# Patient Record
Sex: Female | Born: 1955 | Race: Black or African American | Hispanic: No | Marital: Married | Smoking: Never smoker
Health system: Southern US, Community
[De-identification: ages and names within clinical notes are randomized; demographics above are authoritative.]

## PROBLEM LIST (undated history)

## (undated) ENCOUNTER — Ambulatory Visit: Admission: EM | Payer: 59

## (undated) DIAGNOSIS — E119 Type 2 diabetes mellitus without complications: Secondary | ICD-10-CM

## (undated) DIAGNOSIS — I2510 Atherosclerotic heart disease of native coronary artery without angina pectoris: Secondary | ICD-10-CM

## (undated) DIAGNOSIS — G4730 Sleep apnea, unspecified: Secondary | ICD-10-CM

## (undated) DIAGNOSIS — I10 Essential (primary) hypertension: Secondary | ICD-10-CM

## (undated) DIAGNOSIS — R32 Unspecified urinary incontinence: Secondary | ICD-10-CM

## (undated) DIAGNOSIS — M797 Fibromyalgia: Secondary | ICD-10-CM

## (undated) DIAGNOSIS — N361 Urethral diverticulum: Secondary | ICD-10-CM

## (undated) HISTORY — DX: Type 2 diabetes mellitus without complications: E11.9

## (undated) HISTORY — PX: CARDIAC CATHETERIZATION: SHX172

## (undated) HISTORY — DX: Fibromyalgia: M79.7

## (undated) HISTORY — DX: Urethral diverticulum: N36.1

## (undated) HISTORY — DX: Unspecified urinary incontinence: R32

## (undated) HISTORY — DX: Essential (primary) hypertension: I10

## (undated) HISTORY — PX: FOOT SURGERY: SHX648

## (undated) HISTORY — PX: KNEE SURGERY: SHX244

---

## 1999-04-22 ENCOUNTER — Encounter: Admission: RE | Admit: 1999-04-22 | Discharge: 1999-07-21 | Payer: Self-pay | Admitting: Family Medicine

## 1999-06-13 ENCOUNTER — Other Ambulatory Visit: Admission: RE | Admit: 1999-06-13 | Discharge: 1999-06-13 | Payer: Self-pay | Admitting: Obstetrics and Gynecology

## 1999-11-29 ENCOUNTER — Emergency Department (HOSPITAL_COMMUNITY): Admission: EM | Admit: 1999-11-29 | Discharge: 1999-11-29 | Payer: Self-pay | Admitting: Emergency Medicine

## 1999-12-08 HISTORY — PX: CARDIAC CATHETERIZATION: SHX172

## 2000-02-16 ENCOUNTER — Inpatient Hospital Stay (HOSPITAL_COMMUNITY): Admission: EM | Admit: 2000-02-16 | Discharge: 2000-02-17 | Payer: Self-pay | Admitting: Emergency Medicine

## 2000-02-16 ENCOUNTER — Encounter: Payer: Self-pay | Admitting: *Deleted

## 2000-02-18 ENCOUNTER — Ambulatory Visit (HOSPITAL_COMMUNITY): Admission: RE | Admit: 2000-02-18 | Discharge: 2000-02-18 | Payer: Self-pay | Admitting: *Deleted

## 2000-10-27 ENCOUNTER — Other Ambulatory Visit: Admission: RE | Admit: 2000-10-27 | Discharge: 2000-10-27 | Payer: Self-pay | Admitting: Obstetrics and Gynecology

## 2000-11-22 ENCOUNTER — Encounter: Payer: Self-pay | Admitting: Gastroenterology

## 2000-11-22 ENCOUNTER — Ambulatory Visit (HOSPITAL_COMMUNITY): Admission: RE | Admit: 2000-11-22 | Discharge: 2000-11-22 | Payer: Self-pay | Admitting: Gastroenterology

## 2000-12-28 ENCOUNTER — Ambulatory Visit (HOSPITAL_COMMUNITY): Admission: RE | Admit: 2000-12-28 | Discharge: 2000-12-28 | Payer: Self-pay | Admitting: Gastroenterology

## 2000-12-28 ENCOUNTER — Encounter (INDEPENDENT_AMBULATORY_CARE_PROVIDER_SITE_OTHER): Payer: Self-pay | Admitting: Specialist

## 2000-12-28 ENCOUNTER — Encounter: Payer: Self-pay | Admitting: Gastroenterology

## 2001-06-07 ENCOUNTER — Encounter: Admission: RE | Admit: 2001-06-07 | Discharge: 2001-06-07 | Payer: Self-pay | Admitting: *Deleted

## 2001-11-27 ENCOUNTER — Encounter: Payer: Self-pay | Admitting: Emergency Medicine

## 2001-11-27 ENCOUNTER — Emergency Department (HOSPITAL_COMMUNITY): Admission: EM | Admit: 2001-11-27 | Discharge: 2001-11-27 | Payer: Self-pay | Admitting: Emergency Medicine

## 2001-12-19 ENCOUNTER — Inpatient Hospital Stay (HOSPITAL_COMMUNITY): Admission: AD | Admit: 2001-12-19 | Discharge: 2001-12-22 | Payer: Self-pay | Admitting: *Deleted

## 2001-12-26 ENCOUNTER — Encounter: Admission: RE | Admit: 2001-12-26 | Discharge: 2002-03-26 | Payer: Self-pay | Admitting: *Deleted

## 2002-09-06 ENCOUNTER — Emergency Department (HOSPITAL_COMMUNITY): Admission: EM | Admit: 2002-09-06 | Discharge: 2002-09-06 | Payer: Self-pay | Admitting: Emergency Medicine

## 2002-10-15 ENCOUNTER — Emergency Department (HOSPITAL_COMMUNITY): Admission: EM | Admit: 2002-10-15 | Discharge: 2002-10-16 | Payer: Self-pay | Admitting: Emergency Medicine

## 2003-02-08 ENCOUNTER — Other Ambulatory Visit: Admission: RE | Admit: 2003-02-08 | Discharge: 2003-02-08 | Payer: Self-pay | Admitting: Obstetrics and Gynecology

## 2004-02-11 ENCOUNTER — Other Ambulatory Visit: Admission: RE | Admit: 2004-02-11 | Discharge: 2004-02-11 | Payer: Self-pay | Admitting: Obstetrics and Gynecology

## 2005-06-11 ENCOUNTER — Other Ambulatory Visit: Admission: RE | Admit: 2005-06-11 | Discharge: 2005-06-11 | Payer: Self-pay | Admitting: Obstetrics and Gynecology

## 2006-06-01 ENCOUNTER — Encounter: Admission: RE | Admit: 2006-06-01 | Discharge: 2006-06-01 | Payer: Self-pay | Admitting: Internal Medicine

## 2009-08-22 ENCOUNTER — Ambulatory Visit: Payer: Self-pay | Admitting: Obstetrics and Gynecology

## 2009-08-22 ENCOUNTER — Other Ambulatory Visit: Admission: RE | Admit: 2009-08-22 | Discharge: 2009-08-22 | Payer: Self-pay | Admitting: Obstetrics and Gynecology

## 2009-08-22 ENCOUNTER — Encounter: Payer: Self-pay | Admitting: Obstetrics and Gynecology

## 2009-11-06 ENCOUNTER — Ambulatory Visit: Payer: Self-pay | Admitting: Vascular Surgery

## 2010-02-11 ENCOUNTER — Encounter: Admission: RE | Admit: 2010-02-11 | Discharge: 2010-02-11 | Payer: Self-pay | Admitting: Internal Medicine

## 2010-06-19 ENCOUNTER — Ambulatory Visit (HOSPITAL_COMMUNITY): Admission: RE | Admit: 2010-06-19 | Discharge: 2010-06-20 | Payer: Self-pay | Admitting: Orthopedic Surgery

## 2011-01-01 ENCOUNTER — Other Ambulatory Visit: Payer: Self-pay | Admitting: Obstetrics and Gynecology

## 2011-01-01 ENCOUNTER — Other Ambulatory Visit (HOSPITAL_COMMUNITY)
Admission: RE | Admit: 2011-01-01 | Discharge: 2011-01-01 | Disposition: A | Discharge status: Home / Self Care | Payer: BC Managed Care – PPO | Source: Ambulatory Visit | Attending: Obstetrics and Gynecology | Admitting: Obstetrics and Gynecology

## 2011-01-01 ENCOUNTER — Ambulatory Visit
Admission: RE | Admit: 2011-01-01 | Discharge: 2011-01-01 | Payer: Self-pay | Source: Home / Self Care | Attending: Obstetrics and Gynecology | Admitting: Obstetrics and Gynecology

## 2011-01-01 DIAGNOSIS — Z124 Encounter for screening for malignant neoplasm of cervix: Secondary | ICD-10-CM | POA: Insufficient documentation

## 2011-02-21 LAB — GLUCOSE, CAPILLARY: Glucose-Capillary: 86 mg/dL (ref 70–99)

## 2011-02-22 LAB — URINE MICROSCOPIC-ADD ON

## 2011-02-22 LAB — SURGICAL PCR SCREEN: Staphylococcus aureus: NEGATIVE

## 2011-02-22 LAB — DIFFERENTIAL
Lymphs Abs: 2.4 10*3/uL (ref 0.7–4.0)
Neutro Abs: 6.4 10*3/uL (ref 1.7–7.7)

## 2011-02-22 LAB — GLUCOSE, CAPILLARY
Glucose-Capillary: 119 mg/dL — ABNORMAL HIGH (ref 70–99)
Glucose-Capillary: 257 mg/dL — ABNORMAL HIGH (ref 70–99)
Glucose-Capillary: 83 mg/dL (ref 70–99)
Glucose-Capillary: 94 mg/dL (ref 70–99)
Glucose-Capillary: 98 mg/dL (ref 70–99)

## 2011-02-22 LAB — URINALYSIS, ROUTINE W REFLEX MICROSCOPIC
Bilirubin Urine: NEGATIVE
Hgb urine dipstick: NEGATIVE
Ketones, ur: NEGATIVE mg/dL
Protein, ur: NEGATIVE mg/dL
pH: 6 (ref 5.0–8.0)

## 2011-02-22 LAB — COMPREHENSIVE METABOLIC PANEL
Calcium: 8.7 mg/dL (ref 8.4–10.5)
GFR calc Af Amer: >60 mL/min (ref >60)
GFR calc non Af Amer: >60 mL/min (ref >60)
Potassium: 3.8 mEq/L (ref 3.5–5.1)

## 2011-02-22 LAB — CBC
HCT: 33.8 % — ABNORMAL LOW (ref 36.0–46.0)
Hemoglobin: 11.1 g/dL — ABNORMAL LOW (ref 12.0–15.0)
MCV: 75.7 fL — ABNORMAL LOW (ref 78.0–100.0)
RBC: 4.47 MIL/uL (ref 3.87–5.11)
RDW: 17 % — ABNORMAL HIGH (ref 11.5–15.5)

## 2011-02-22 LAB — PROTIME-INR: INR: 1.08 (ref 0.00–1.49)

## 2011-04-21 NOTE — Procedures (Signed)
DUPLEX DEEP VENOUS EXAM - LOWER EXTREMITY   INDICATION:  Calf tenderness and pain, rule out DVT.   HISTORY:  Edema:  No.  Trauma/Surgery:  Hit left knee on a coffee table a few days ago.  Pain:  Left calf tenderness for 2 days.  PE:  No.  Previous DVT:  No.  Anticoagulants:  No.  Other:  No.   DUPLEX EXAM:                CFV   SFV   PopV  PTV    GSV                R  L  R  L  R  L  R   L  R  L  Thrombosis    o  o     o     o      o     o  Spontaneous   +  +     +     +      +     +  Phasic        +  +     +     +      +     +  Augmentation  +  +     +     +      +     +  Compressible  +  +     +     +      +     +  Competent   Legend:  + - yes  o - no  p - partial  D - decreased   IMPRESSION:  No evidence of acute deep vein thrombosis noted in the left  lower extremity, based on decreased visualization of the calf veins due  to patient body habitus.    _____________________________  Larina Earthly, M.D.   CH/MEDQ  D:  11/06/2009  T:  11/07/2009  Job:  [id number]

## 2011-04-24 NOTE — H&P (Signed)
[place]. [facility]  Patient:    Cheryl Mckee, Cheryl Mckee Visit Number: [id number] MRN: [id number]          Service Type: Attending:  Lilia Pro, M.D. Dictated by:   Tarri Fuller, P.A. Adm. Date:  12/19/01                           History and Physical  DATE OF BIRTH: 1956-08-17  CHIEF COMPLAINT: Problems with blood sugar.  HISTORY OF PRESENT ILLNESS: This 55 year old female was seen in the practice Friday with complaints of poor control of her blood sugar - her morning reading typically range between 350-400.  She was complaining of blurred vision, thirstiness, and some nausea.  She was currently at that time on 25 units of NPH and sliding-scale of Humalog daily.  She was changed to 25 units of Lantus and continued on her sliding-scale and Avandia on Friday and a metabolic panel and urinalysis was obtained.  This morning her laboratory work was brought to our attention - she had a glucose level of 880, sodium of 121, potassium 5, chloride 82, BUN 10, creatinine 1.  We recalled her to the office immediately for scheduled admission to the hospital for uncontrolled diabetes, hyperglycemia, and hyponatremia.  This afternoon she reports that she continues to feel the same, with about two episodes of vomiting daily, fatigue and malaise, dizziness upon standing, a very metallic taste in her mouth whenever she tries to eat, urinary frequency, excessive thirst, and blurred vision.  She has had a slight sore throat.  She denies abdominal pain, chest pain or dyspnea, syncope, palpitations, urinary dysuria or hematuria, melena or hematochezia, diarrhea, fever, or rash.  She started on Lantus 25 units Friday evening and is taking about 10 units of her Humalog with meals.  Her sugar has generally been between 350-380 or unregisterable on her glucometer.  CURRENT MEDICATIONS:  1. Lantus 25 units q.p.m.  2. Humalog sliding-scale.  3. Effexor 225 mg q.d.  4. Lexapro  10 mg q.d.  5. Flexeril as needed.  6. Avandia 8 mg q.d.  7. Phenergan as needed.  8. Remeron q.h.s.  9. Celebrex 200 mg q.d. 10. Ultram as needed for pain. 11. Aspirin q.d.  ALLERGIES:  1. DAYPRO.  2. Z-PAK.  3. ACCUPRIL.  PAST MEDICAL HISTORY:  1. Type 2 diabetes, on insulin.  2. Morbid obesity.  3. Hyperlipidemia.  4. Hypertension.  5. GERD.  6. Hepatic steatosis.  7. Fibromyalgia.  8. Chronic depression.  9. History of atypical chest pain.  PAST SURGICAL HISTORY:  1. Cardiac catheterization in February 2001, which was negative.  2. Persantine Cardiolite stress test in February 2001, which showed     multiple defects, probably secondary to breast attenuation, with an     ejection fraction of 56%.  It was impossible to rule out ischemia     based on the artifact present on her study.  CONSULTING PHYSICIANS:  1. Cardiology, Dr. Jenne Campus.  2. Allergist, Dr. [place] Callas.  3. GI, Dr. Loreta Ave.  4. Psychiatrist, Dr. Evelene Croon.  5. Gynecologist, Dr. Eda Paschal.  6. Rheumatology, Dr. Jimmy Footman.  SOCIAL HISTORY: The patient is single, without children, and lives alone.  She does not smoke or use alcohol.  Employed by [employer].  FAMILY HISTORY: Diabetes present in father and grandmother.  Grandmother had a heart attack at age 56.  History of throat cancer in her grandmother, and cervical cancer in one  aunt.  PHYSICAL EXAMINATION:  VITAL SIGNS: Age 55.  Weight 300 pounds, which is up five pounds since Friday, and down 12 pounds since the end of November 2002.  Blood pressure 124/88 and stable.  GENERAL: Morbidly obese African-American female, appears in no acute distress with, as always, a very flat affect.  No dyspnea or confusion is present.  HEENT: Oropharynx pink and moist, with moist mucous membranes and midline uvula without deviation.  Symmetric face.  PERRLA.  EOMI without nystagmus. No visual field deficit.  NECK: Supple without lymphadenopathy,  thyromegaly, retraction, or bruits.  CARDIAC: Tachycardic without murmur.  CHEST: Clear.  ABDOMEN: Morbidly obese, soft, nontender, nondistended, with active bowel sounds.  No rigidity, rebound, or guarding.  Unable to fully assess for organomegaly.  BREAST: Extremely large and pendulous.  GU/RECTAL: Examinations deferred.  EXTREMITIES: Warm and dry without rash.  No peripheral edema.  NEUROLOGIC: Generally areflexic.  Gait is slow but stable.  ASSESSMENT:  1. Profound hyperglycemia with uncontrolled type 2 diabetes.  2. Hyponatremia, hyperosmolar.  3. Nausea and vomiting.  4. Weakness.  5. Blurred vision.  6. Hypertension.  7. Fibromyalgia.  8. Morbid obesity.  9. Gastroesophageal reflux disease. 10. Hepatic steatosis.  PLAN: The patient is admitted to bed 5044 #2 at [facility]. [facility].  Obtain STAT BMP, CBC, and urinalysis on admission, and base further insulin and fluid therapy no these results.  In the interim, start an aggressive low-sensitivity insulin sliding-scale protocol and normal saline at 125 cc per hour.  Portable chest x-ray on admission.  Repeat electrolytes and glucose every two hours x3, then every six hours.  Vitals q.shift.  I&Os q.shift.  Diabetic regular diet, with activity as tolerated.  DTCA consultation.  Possible endocrine consultation will be needed.  Patient admitted to Dr. Johnsie Kindred and discharged from the office with a friend, in stable condition. Dictated by:   Tarri Fuller, P.A. Attending:  Lilia Pro, M.D. DD:  12/19/01 TD:  12/20/01 Job: [id number]

## 2011-04-24 NOTE — Discharge Summary (Signed)
[place]. [facility]  Patient:    Cheryl Mckee, Cheryl Mckee Visit Number: [id number] MRN: [id number]          Service Type: MED Location: [phone] Attending Physician:  Noland Fordyce Dictated by:   Lilia Pro, M.D. Admit Date:  12/19/2001 Discharge Date: 12/22/2001                             Discharge Summary  DISCHARGE DIAGNOSES: 1. Hyperglycemia. 2. Mild hyperosmolar state.  HISTORY:  This is a 56 year old African-American female with type 2 diabetes, who was started on insulin about two months ago.  Her sugars have been running high at home, and she started to have increased urinary frequency, blurred vision, and vomiting.  She was seen in our office by one of our physician assistants on Friday the 11th, and her sugar was checked, but the results did not come in until Monday the 13th.  Because of these results, she was immediately admitted to the hospital for hyperglycemia.  Her sugar was around 800.  She notes that she has been having these symptoms for several days, and that her sugar has basically been reading very high on her home monitor for weeks.  Her exam on admission was basically benign without any abnormalities noted.  HOSPITAL COURSE:  The patient was admitted and started on subcu sliding scale insulin.  On the first evening she was here, this did have to be changed to an insulin drip.  She was also increased on her NPH from 20 units q.d. up to 25 units b.i.d.  Throughout her three day hospital course, she was frequently switched from her insulin drip on the Glucomander back to a subcu sliding scale when her sugars would go high, which was more likely in the evenings. She had no complaints of chest pain, shortness of breath, nausea, vomiting. She felt well throughout her hospital stay; however, continued to have high sugars.  Her sugars did eventually come down to around the 200 level.  We did get a diabetic coordinator to see her, who  recommended outpatient treatment as well as some recommendations on how to put her on NPH with the Glucomander. We did get her set up for outpatient education on Monday the 20th at 5:30.  She will be discharged today in stable condition.  She will be discharged on a very different sliding scale than she had before.  Her regular sliding scale will be as follows:  For sugars 151-250, she should take 6 units of insulin regular; 251-350, 12 units; 351-450, 15 units.  She will also increase her NPH to 25 units b.i.d.  I have given her strict instructions on this.  She will be checking her sugars before breakfast and dinner and using the sliding scale.  I will have her, over the weekend, in addition, do this at lunch.  She is to call our office if her sugars persistently stay above 300, and I will see her next week in follow-up.  She will continue all home medications. She was on Avandia at home.  Dictated by:   Lilia Pro, M.D. Attending Physician:  Noland Fordyce DD:  12/22/01 TD:  12/23/01 Job: [id number]

## 2011-04-24 NOTE — Cardiovascular Report (Signed)
[place]. [facility]  Patient:    Cheryl Mckee, Cheryl Mckee                       MRN: [id number] Proc. Date: 02/18/00 Adm. Date:  [id number] Attending:  Darlin Priestly CC:         Warrick Parisian, M.D.                        Cardiac Catheterization  ATTENDING:  Darlin Priestly, M.D.  PROCEDURES: 1. Left heart catheterization. 2. Coronary angiogram. 3. Left ventriculogram.  COMPLICATIONS:  None.  INDICATIONS:  [name] is a 55 year old black female patient of Dr. Claris Gower . Karriker with a history of non-insulin-dependent diabetes mellitus, hypertension, hypercholesterolemia, who was referred to our office for increasing chest pressure associated with shortness of breath and occasional nausea.  She underwent a stress Cardiolite, which revealed multiple defects, probably consistent with attenuation. However, given her multiple risk factors and persistent symptoms, we elected to  proceed with cardiac catheterization to further define her coronary anatomy.  DESCRIPTION OF PROCEDURE:  After giving informed written consent, the patient brought to the cardiac catheterization laboratory, where right and left groins ere shaved, prepped and draped in usual sterile fashion.  ECG monitoring was established.  Using modified Seldinger technique, a #6-French arterial sheath was inserted into the right femoral artery.  A 6-French diagnostic catheter was then used to perform diagnostic angiography.  This revealed a short, large left main  with no significant disease.  LAD was a large vessel, which coursed to the apex and gave rise to three diagonal branches.  LAD had no significant disease.  First, seconds and third diagonal branches had no significant disease.  The A-V groove  circumflex was a large vessel, coursed in the A-V groove and gave rise to three  obtuse marginal branches.  The A-V groove circumflex had a 20% ostial stenosis.  First OM was a  large vessel, which bifurcates distally and has no significant disease.  Second and third marginals were medium size vessels with no significant disease.  The right coronary artery was a medium size vessel, which was dominant and gave rise to both PDA, as well as, a posterolateral branch.  The RCA, PDA, nd posterolateral branch had no significant disease.  Left ventriculogram reveals a preserved EF calculated at 50%.  There was no significant MR.  HEMODYNAMICS:  Systemic arterial pressure 131/86, LV systemic pressure 136/7, LVEDP of 14.  IMPRESSION: 1. Essentially normal coronary arteries. 2. Low normal LV systolic function. DD:  02/18/00 TD:  02/18/00 Job: [id number]

## 2011-05-05 ENCOUNTER — Other Ambulatory Visit: Payer: Self-pay | Admitting: Internal Medicine

## 2011-05-05 DIAGNOSIS — R1011 Right upper quadrant pain: Secondary | ICD-10-CM

## 2011-05-14 ENCOUNTER — Ambulatory Visit
Admission: RE | Admit: 2011-05-14 | Discharge: 2011-05-14 | Disposition: A | Discharge status: Home / Self Care | Payer: BC Managed Care – PPO | Source: Ambulatory Visit | Attending: Internal Medicine | Admitting: Internal Medicine

## 2011-05-14 DIAGNOSIS — R1011 Right upper quadrant pain: Secondary | ICD-10-CM

## 2012-10-05 HISTORY — PX: COLONOSCOPY: SHX174

## 2012-11-25 ENCOUNTER — Encounter: Payer: Self-pay | Admitting: Gynecology

## 2012-11-25 DIAGNOSIS — M797 Fibromyalgia: Secondary | ICD-10-CM | POA: Insufficient documentation

## 2012-11-25 DIAGNOSIS — K745 Biliary cirrhosis, unspecified: Secondary | ICD-10-CM | POA: Insufficient documentation

## 2012-11-28 ENCOUNTER — Other Ambulatory Visit (HOSPITAL_COMMUNITY)
Admission: RE | Admit: 2012-11-28 | Discharge: 2012-11-28 | Disposition: A | Discharge status: Home / Self Care | Payer: BC Managed Care – PPO | Source: Ambulatory Visit | Attending: Obstetrics and Gynecology | Admitting: Obstetrics and Gynecology

## 2012-11-28 ENCOUNTER — Ambulatory Visit (INDEPENDENT_AMBULATORY_CARE_PROVIDER_SITE_OTHER): Payer: BC Managed Care – PPO | Admitting: Obstetrics and Gynecology

## 2012-11-28 ENCOUNTER — Encounter: Payer: Self-pay | Admitting: Obstetrics and Gynecology

## 2012-11-28 VITALS — BP 130/84 | Ht 69.0 in | Wt 246.0 lb

## 2012-11-28 DIAGNOSIS — I10 Essential (primary) hypertension: Secondary | ICD-10-CM | POA: Insufficient documentation

## 2012-11-28 DIAGNOSIS — F329 Major depressive disorder, single episode, unspecified: Secondary | ICD-10-CM | POA: Insufficient documentation

## 2012-11-28 DIAGNOSIS — E119 Type 2 diabetes mellitus without complications: Secondary | ICD-10-CM | POA: Insufficient documentation

## 2012-11-28 DIAGNOSIS — N912 Amenorrhea, unspecified: Secondary | ICD-10-CM

## 2012-11-28 DIAGNOSIS — Z01419 Encounter for gynecological examination (general) (routine) without abnormal findings: Secondary | ICD-10-CM | POA: Insufficient documentation

## 2012-11-28 NOTE — Patient Instructions (Signed)
Schedule mammogram. Scheduled pelvic ultrasound.

## 2012-11-28 NOTE — Progress Notes (Signed)
Patient came to see me today for her annual GYN exam. Recently she has had oligo amenorrhea. When I last saw her her in January, 2011 her [facility] was 11.7. She did respond to Provera withdrawal. She is a diabetic hypertensive overweight female and therefore is at higher risk for endometrial cancer. We discussed continuing Provera withdrawal every 2-3 months but she has not done so. Her last menstrual cycle was 3-4 months ago.She is due for a mammogram. She is having no pelvic pain. She has always had normal  Pap smears. Her last Pap smear was January 2012.She is sexually active and is not using birth control.  Physical examination:Kim Julian Reil present. HEENT within normal limits. Neck: Thyroid not large. No masses. Supraclavicular nodes: not enlarged. Breasts: Examined in both sitting and lying  position. No skin changes and no masses. Abdomen: Soft no guarding rebound or masses or hernia. Pelvic: External: Within normal limits. BUS: Within normal limits. Vaginal:within normal limits. Good estrogen effect. No evidence of cystocele rectocele or enterocele. Cervix: clean. Uterus: Normal size and shape. Adnexa: No masses. Rectovaginal exam: Confirmatory and negative. Extremities: Within normal limits.  Assessment: Oligo amenorrhea  Plan: FSH drawn. Qualitative hCG. Discussed importance of Provera withdrawal if she is premenopausal. Mammogram. Asked her to schedule pelvic ultrasound due to her weight of 300 pounds to be sure about about  Ovarian pathology.Pap done.The new Pap smear guidelines were discussed with the patient.

## 2012-11-28 NOTE — Addendum Note (Signed)
Addended by: Dayna Barker on: 11/28/2012 04:31 PM   Modules accepted: Orders

## 2012-11-29 LAB — HCG, SERUM, QUALITATIVE: Preg, Serum: NEGATIVE

## 2012-11-29 LAB — FOLLICLE STIMULATING HORMONE: FSH: 8.4 m[IU]/mL

## 2012-12-02 ENCOUNTER — Other Ambulatory Visit: Payer: Self-pay | Admitting: Obstetrics and Gynecology

## 2012-12-02 ENCOUNTER — Ambulatory Visit (INDEPENDENT_AMBULATORY_CARE_PROVIDER_SITE_OTHER): Payer: BC Managed Care – PPO | Admitting: Obstetrics and Gynecology

## 2012-12-02 ENCOUNTER — Other Ambulatory Visit (INDEPENDENT_AMBULATORY_CARE_PROVIDER_SITE_OTHER): Payer: BC Managed Care – PPO

## 2012-12-02 DIAGNOSIS — N915 Oligomenorrhea, unspecified: Secondary | ICD-10-CM

## 2012-12-02 DIAGNOSIS — N912 Amenorrhea, unspecified: Secondary | ICD-10-CM

## 2012-12-02 MED ORDER — MEDROXYPROGESTERONE ACETATE 10 MG PO TABS: ORAL_TABLET | ORAL | Status: DC

## 2012-12-02 NOTE — Patient Instructions (Signed)
Take medication to bring on menstrual cycle.

## 2012-12-02 NOTE — Progress Notes (Addendum)
Patient came back today for ultrasound. On ultrasound her uterus appears normal. Her endometrial echo is 6.4 mm. Her right ovary was normal. The left ovary could not be identified due to the bowel gas but there was no mass on the left side. Her cul-de-sac was free of fluid. She was reassured. Her FSH came back less than 10. She will do Provera withdrawal. She will repeated every 3 months that she does not bleed. She will call us if it ever fails to bring on a menstrual cycle.  We also discussed birth control. Patient has intercourse approximately 3 times a month. She had a negative pregnancy test. Up to 3-4 years ago she used oral contraceptives. She stopped because she was not coming to see [place]. She asked me today if she needed birth control. I told her it was possible for her to get pregnant with an [facility] of less than 10. I told her I was somewhat reluctant to give her birth control pills at her age, with her weight, with her history of diabetes, hypertension and biliary cirrhosis. I suggested she use condoms. She will also discuss oral contraceptives with her medical doctor and get his advice.

## 2013-12-07 DIAGNOSIS — N361 Urethral diverticulum: Secondary | ICD-10-CM

## 2013-12-07 HISTORY — DX: Urethral diverticulum: N36.1

## 2013-12-27 ENCOUNTER — Telehealth: Payer: Self-pay | Admitting: *Deleted

## 2013-12-27 ENCOUNTER — Encounter: Payer: Self-pay | Admitting: Gynecology

## 2013-12-27 ENCOUNTER — Ambulatory Visit (INDEPENDENT_AMBULATORY_CARE_PROVIDER_SITE_OTHER): Payer: BC Managed Care – PPO | Admitting: Gynecology

## 2013-12-27 VITALS — BP 120/78 | Ht 69.0 in | Wt 308.0 lb

## 2013-12-27 DIAGNOSIS — L723 Sebaceous cyst: Secondary | ICD-10-CM

## 2013-12-27 DIAGNOSIS — N898 Other specified noninflammatory disorders of vagina: Secondary | ICD-10-CM

## 2013-12-27 DIAGNOSIS — Z01419 Encounter for gynecological examination (general) (routine) without abnormal findings: Secondary | ICD-10-CM

## 2013-12-27 DIAGNOSIS — N912 Amenorrhea, unspecified: Secondary | ICD-10-CM

## 2013-12-27 NOTE — Telephone Encounter (Signed)
Appt. 01/10/14 @ 8:45 am with Dr.Ottelin, notes faxed. Pt informed.

## 2013-12-27 NOTE — Progress Notes (Signed)
Cheryl BalesSharon A Mckee 08/10/1956 [id number]        58 y.o.  G0P0 for annual exam.  Former patient Dr. Eda Mckee. Several issues noted below.  Past medical history,surgical history, problem list, medications, allergies, family history and social history were all reviewed and documented in the EPIC chart.  ROS:  Performed and pertinent positives and negatives are included in the history, assessment and plan .  Exam: Kim assistant Filed Vitals:   12/27/13 1144  BP: 120/78  Height: 5\' 9"  (1.753 m)  Weight: 308 lb (139.708 kg)   General appearance  Normal Skin grossly normal Head/Neck normal with no cervical or supraclavicular adenopathy thyroid normal Lungs  clear Cardiac RR, without RMG Abdominal  soft, nontender, without masses, organomegaly or hernia Breasts  examined lying and sitting without masses, retractions, discharge or axillary adenopathy. Classic 2 cm sebaceous cyst left axillary area. Connected to the skin with overlying pore. Pelvic  Ext/BUS/vagina  2 cm cystic area 2 fingerbreadths within the vaginal opening suburethral.  Cervix  Normal  Uterus  grossly normal limited by abdominal girth  Adnexa  Without gross masses or tenderness    Anus and perineum  Normal   Rectovaginal  Normal sphincter tone without palpated masses or tenderness.    Assessment/Plan:  58 y.o. G0P0 female for annual exam.   1. Irregular menses. Patient with long history of irregular/absent menses. Has been doing every 3 months withdrawal on Provera 10 mg for 5 days per Dr. Eda Mckee. FSH last year was 8 last year. Last withdrawal was September where she spotted for one day. Had ultrasound last year where endometrial echo is 6.3 mm right ovary normal left ovary not visualized. Will check FSH today. Patient not having overt hot flushes or night sweats. If normal then recommend every 2-3 months withdrawal with 12 days of Provera 10 mg daily. If FSH elevated then we'll plan expectant management with reporting of  any bleeding. 2. Sebaceous cyst left axilla. Patient knows to present for years unchanged. Classic in appearance. Patient will continue to monitor. 3. Suburethral midline vaginal cyst. Patient is asymptomatic without discomfort. Question how long it's been present although not noted last year and Dr. Verl Mckee's exam. Recommend urology evaluation. Patient knows that we will help her set up this appointment and agrees to follow up with this. 4. Mammography 11/2013. Continued annual mammography. SBE monthly reviewed. 5. Pap smear 11/2012. No Pap smear done today. No history of abnormal Pap smears. Plan repeat Pap smear in 3 year interval. 6. Colonoscopy 2011. Repeat it are recommended interval. 7. Health maintenance. No routine blood work done and she has this all done through her primary physician's office who follows her for her medical issues. [facility]   Note: This document was prepared with digital dictation and possible smart phrase technology. Any transcriptional errors that result from this process are unintentional.   Cheryl Mckee,Cheryl Mckee, 12:25 PM 12/27/2013

## 2013-12-27 NOTE — Patient Instructions (Signed)
Followup for hormone test result. Office will contact you to arrange a urology appointment. Call if you desire to have the sebaceous cyst removed from underneath her arm. Followup in 1 year for annual exam.

## 2013-12-27 NOTE — Telephone Encounter (Signed)
Message copied by Aura CampsWEBB, JENNIFER L on Wed Dec 27, 2013  4:17 PM ------      Message from: Dara LordsFONTAINE, TIMOTHY P      Created: Wed Dec 27, 2013 12:32 PM       Schedule urology appointment reference suburethral vaginal cyst evaluation ------

## 2013-12-28 LAB — URINALYSIS W MICROSCOPIC + REFLEX CULTURE
Bilirubin Urine: NEGATIVE
CASTS: NONE SEEN
Crystals: NONE SEEN
GLUCOSE, UA: 500 mg/dL — AB
HGB URINE DIPSTICK: NEGATIVE
KETONES UR: NEGATIVE mg/dL
Leukocytes, UA: NEGATIVE
Nitrite: NEGATIVE
PROTEIN: 30 mg/dL — AB
Specific Gravity, Urine: 1.025 (ref 1.005–1.030)
Urobilinogen, UA: 0.2 mg/dL (ref 0.0–1.0)
pH: 6.5 (ref 5.0–8.0)

## 2013-12-28 LAB — FOLLICLE STIMULATING HORMONE: FSH: 7.5 m[IU]/mL

## 2014-01-01 ENCOUNTER — Other Ambulatory Visit: Payer: Self-pay | Admitting: Gynecology

## 2014-01-01 MED ORDER — MEDROXYPROGESTERONE ACETATE 10 MG PO TABS: 10.0000 mg | ORAL_TABLET | Freq: Every day | ORAL | Status: DC

## 2014-01-11 ENCOUNTER — Other Ambulatory Visit: Payer: Self-pay | Admitting: Urology

## 2014-01-11 DIAGNOSIS — N361 Urethral diverticulum: Secondary | ICD-10-CM

## 2014-01-12 ENCOUNTER — Encounter: Payer: Self-pay | Admitting: Neurology

## 2014-01-17 ENCOUNTER — Ambulatory Visit
Admission: RE | Admit: 2014-01-17 | Discharge: 2014-01-17 | Disposition: A | Discharge status: Home / Self Care | Payer: BC Managed Care – PPO | Source: Ambulatory Visit | Attending: Urology | Admitting: Urology

## 2014-01-17 DIAGNOSIS — N361 Urethral diverticulum: Secondary | ICD-10-CM

## 2014-01-17 MED ORDER — GADOBENATE DIMEGLUMINE 529 MG/ML IV SOLN
20.0000 mL | Freq: Once | INTRAVENOUS | Status: AC | PRN
  Administered 2014-01-17: 20 mL via INTRAVENOUS

## 2014-01-18 ENCOUNTER — Other Ambulatory Visit: Payer: BC Managed Care – PPO

## 2014-01-18 ENCOUNTER — Ambulatory Visit: Payer: BC Managed Care – PPO | Admitting: Neurology

## 2014-01-19 ENCOUNTER — Encounter: Payer: Self-pay | Admitting: Neurology

## 2014-01-22 ENCOUNTER — Ambulatory Visit: Payer: BC Managed Care – PPO | Admitting: Neurology

## 2014-01-22 ENCOUNTER — Telehealth: Payer: Self-pay | Admitting: Neurology

## 2014-01-22 NOTE — Telephone Encounter (Signed)
Patient was a no show for 01/22/2014 new patient visit.  

## 2014-01-31 ENCOUNTER — Ambulatory Visit: Payer: BC Managed Care – PPO | Admitting: Neurology

## 2014-01-31 ENCOUNTER — Telehealth: Payer: Self-pay | Admitting: Neurology

## 2014-01-31 NOTE — Telephone Encounter (Signed)
Patient was a no show for new patient visit on 01/31/2014

## 2014-02-05 ENCOUNTER — Encounter: Payer: Self-pay | Admitting: Neurology

## 2014-05-31 ENCOUNTER — Ambulatory Visit (INDEPENDENT_AMBULATORY_CARE_PROVIDER_SITE_OTHER): Payer: BC Managed Care – PPO | Admitting: Family Medicine

## 2014-05-31 VITALS — BP 150/92 | HR 75 | Temp 98.0°F | Resp 20 | Ht 69.0 in | Wt 311.2 lb

## 2014-05-31 DIAGNOSIS — B372 Candidiasis of skin and nail: Secondary | ICD-10-CM

## 2014-05-31 DIAGNOSIS — L259 Unspecified contact dermatitis, unspecified cause: Secondary | ICD-10-CM

## 2014-05-31 DIAGNOSIS — I10 Essential (primary) hypertension: Secondary | ICD-10-CM

## 2014-05-31 DIAGNOSIS — B353 Tinea pedis: Secondary | ICD-10-CM

## 2014-05-31 MED ORDER — NYSTATIN 100000 UNIT/GM EX OINT: 1.0000 "application " | TOPICAL_OINTMENT | Freq: Two times a day (BID) | CUTANEOUS | Status: DC

## 2014-05-31 MED ORDER — PREDNISONE 20 MG PO TABS: 40.0000 mg | ORAL_TABLET | Freq: Every day | ORAL | Status: DC

## 2014-05-31 NOTE — Progress Notes (Signed)
Cheryl BalesSharon A Mckee is a 58 y.o. female who presents to Urgent Care today with complaints of rash:  1.  Rash:  Started about 2 weeks ago on neck and between breasts, worse with heavy sweating.  Itches.  About 4-5 days ago noticed small bumps across chest, which are much itchier than between breasts.  Has taken Benadryl at night but cannot take anything during day because doesn't want to be sleepy.  Tried several OTC creams but no relief.  No new exposures.   PMH reviewed.  Past Medical History  Diagnosis Date  . Cirrhosis, biliary   . Fibromyalgia   . Diabetes mellitus without complication   . Depression   . Hypertension   . Asthma    Past Surgical History  Procedure Laterality Date  . Foot surgery    . Knee surgery    . Cardiac catheterization    . Colonoscopy  10/05/12    Medications reviewed. Current Outpatient Prescriptions  Medication Sig Dispense Refill  . atorvastatin (LIPITOR) 40 MG tablet Take 40 mg by mouth daily.      . Cyclobenzaprine HCl (FLEXERIL PO) Take by mouth.      . Insulin Aspart (NOVOLOG [place]) Inject into the skin.      Marland Kitchen. insulin glargine (LANTUS) 100 UNIT/ML injection Inject into the skin at bedtime.      Marland Kitchen. lisinopril (PRINIVIL,ZESTRIL) 40 MG tablet Take 40 mg by mouth daily.      . medroxyPROGESTERone (PROVERA) 10 MG tablet Take 1 tablet (10 mg total) by mouth daily.  12 tablet  6  . METFORMIN HCL PO Take 500 mg by mouth.       Marland Kitchen. PRAVASTATIN SODIUM PO Take by mouth.      . TraMADol HCl (ULTRAM PO) Take by mouth.      . traZODone (DESYREL) 50 MG tablet Take 50 mg by mouth at bedtime.      Marland Kitchen. nystatin ointment (MYCOSTATIN) Apply 1 application topically 2 (two) times daily. Apply to breasts and chin  30 g  0  . predniSONE (DELTASONE) 20 MG tablet Take 2 tablets (40 mg total) by mouth daily with breakfast. X 7 days  14 tablet  0   No current facility-administered medications for this visit.    ROS as above otherwise neg.  No chest pain, palpitations, SOB, Fever,  Chills, Abd pain, N/V/D.   Physical Exam:  BP 150/92  Pulse 75  Temp(Src) 98 F (36.7 C) (Oral)  Resp 20  Ht 5\' 9"  (1.753 m)  Wt 311 lb 3.2 oz (141.159 kg)  BMI 45.94 kg/m2  SpO2 98% Gen:  Alert, cooperative patient who appears stated age in no acute distress.  Vital signs reviewed.  Obese Skin: Hyperpigmented rash between breasts.  Satellite lesions upper chest.  Also similar intertrigal appearance anterior neck.  No maceration.  Scattered across upper chest and left arm are small, pinpoint papules which do not resemble yeast but rather contact dermatitis.  Evidence of tinea pedis dorsum of both feet.   Assessment and Plan:  1.  Contact dermatitis: - unclear origin - steroid burst to reduce symptoms - Cetirizine daily usage for breakthrough itching  2.  Yeast intertrigo: - Nystatin to treat   3.  Tinea pedis: - recommended clotrimazole   4.  HTN: - labile pressures - taking her medications, though not everyday - stressed compliance and FU with PCP

## 2014-05-31 NOTE — Patient Instructions (Signed)
Take the Claritin daily for the next 2 weeks or so to help with the itching.    Take the Prednisone daily for 7 days.  This will make sure your sugars go up.  I"m sending in a lotion for the yeast infection between your breasts and your neck.  Try on your feet as well.  The over the counter medicine for Athletes foot is clotrimazole.    It was good to meet you today.  If this isn't any better, please let us know!

## 2014-06-14 ENCOUNTER — Other Ambulatory Visit: Payer: Self-pay | Admitting: Family Medicine

## 2014-06-15 ENCOUNTER — Ambulatory Visit (INDEPENDENT_AMBULATORY_CARE_PROVIDER_SITE_OTHER): Payer: BC Managed Care – PPO | Admitting: Emergency Medicine

## 2014-06-15 ENCOUNTER — Ambulatory Visit (INDEPENDENT_AMBULATORY_CARE_PROVIDER_SITE_OTHER): Payer: BC Managed Care – PPO

## 2014-06-15 VITALS — BP 138/88 | HR 84 | Temp 97.9°F | Resp 18 | Ht 69.0 in | Wt 313.8 lb

## 2014-06-15 DIAGNOSIS — Z9119 Patient's noncompliance with other medical treatment and regimen: Secondary | ICD-10-CM

## 2014-06-15 DIAGNOSIS — E119 Type 2 diabetes mellitus without complications: Secondary | ICD-10-CM

## 2014-06-15 DIAGNOSIS — M79671 Pain in right foot: Secondary | ICD-10-CM

## 2014-06-15 DIAGNOSIS — G4733 Obstructive sleep apnea (adult) (pediatric): Secondary | ICD-10-CM

## 2014-06-15 DIAGNOSIS — M79609 Pain in unspecified limb: Secondary | ICD-10-CM

## 2014-06-15 DIAGNOSIS — Z91199 Patient's noncompliance with other medical treatment and regimen due to unspecified reason: Secondary | ICD-10-CM

## 2014-06-15 DIAGNOSIS — M722 Plantar fascial fibromatosis: Secondary | ICD-10-CM

## 2014-06-15 LAB — POCT GLYCOSYLATED HEMOGLOBIN (HGB A1C): Hemoglobin A1C: 8.7

## 2014-06-15 MED ORDER — CELECOXIB 200 MG PO CAPS: 200.0000 mg | ORAL_CAPSULE | Freq: Every day | ORAL | Status: DC

## 2014-06-15 MED ORDER — FLUCONAZOLE 100 MG PO TABS: 100.0000 mg | ORAL_TABLET | Freq: Every day | ORAL | Status: DC

## 2014-06-15 NOTE — Patient Instructions (Addendum)
Cutaneous Candidiasis Cutaneous candidiasis is a condition in which there is an overgrowth of yeast (candida) on the skin. Yeast normally live on the skin, but in small enough numbers not to cause any symptoms. In certain cases, increased growth of the yeast may cause an actual yeast infection. This kind of infection usually occurs in areas of the skin that are constantly warm and moist, such as the armpits or the groin. Yeast is the most common cause of diaper rash in babies and in people who cannot control their bowel movements (incontinence). CAUSES  The fungus that most often causes cutaneous candidiasis is Candida albicans. Conditions that can increase the risk of getting a yeast infection of the skin include:  Obesity.  Pregnancy.  Diabetes.  Taking antibiotic medicine.  Taking birth control pills.  Taking steroid medicines.  Thyroid disease.  An iron or zinc deficiency.  Problems with the immune system. SYMPTOMS   Red, swollen area of the skin.  Bumps on the skin.  Itchiness. DIAGNOSIS  The diagnosis of cutaneous candidiasis is usually based on its appearance. Light scrapings of the skin may also be taken and viewed under a microscope to identify the presence of yeast. TREATMENT  Antifungal creams may be applied to the infected skin. In severe cases, oral medicines may be needed.  HOME CARE INSTRUCTIONS   Keep your skin clean and dry.  Maintain a healthy weight.  If you have diabetes, keep your blood sugar under control. SEEK IMMEDIATE MEDICAL CARE IF:  Your rash continues to spread despite treatment.  You have a fever, chills, or abdominal pain. Document Released: 08/11/2011 Document Revised: 02/15/2012 Document Reviewed: 08/11/2011 [facility] Patient Information 2015 [place]. This information is not intended to replace advice given to you by your health care provider. Make sure you discuss any questions you have with your health care provider. Plantar  Fasciitis Plantar fasciitis is a common condition that causes foot pain. It is soreness (inflammation) of the band of tough fibrous tissue on the bottom of the foot that runs from the heel bone (calcaneus) to the ball of the foot. The cause of this soreness may be from excessive standing, poor fitting shoes, running on hard surfaces, being overweight, having an abnormal walk, or overuse (this is common in runners) of the painful foot or feet. It is also common in aerobic exercise dancers and ballet dancers. SYMPTOMS  Most people with plantar fasciitis complain of:  Severe pain in the morning on the bottom of their foot especially when taking the first steps out of bed. This pain recedes after a few minutes of walking.  Severe pain is experienced also during walking following a long period of inactivity.  Pain is worse when walking barefoot or up stairs DIAGNOSIS   Your caregiver will diagnose this condition by examining and feeling your foot.  Special tests such as X-rays of your foot, are usually not needed. PREVENTION   Consult a sports medicine professional before beginning a new exercise program.  Walking programs offer a good workout. With walking there is a lower chance of overuse injuries common to runners. There is less impact and less jarring of the joints.  Begin all new exercise programs slowly. If problems or pain develop, decrease the amount of time or distance until you are at a comfortable level.  Wear good shoes and replace them regularly.  Stretch your foot and the heel cords at the back of the ankle (Achilles tendon) both before and after exercise.  Run  or exercise on even surfaces that are not hard. For example, asphalt is better than pavement.  Do not run barefoot on hard surfaces.  If using a treadmill, vary the incline.  Do not continue to workout if you have foot or joint problems. Seek professional help if they do not improve. HOME CARE INSTRUCTIONS   Avoid  activities that cause you pain until you recover.  Use ice or cold packs on the problem or painful areas after working out.  Only take over-the-counter or prescription medicines for pain, discomfort, or fever as directed by your caregiver.  Soft shoe inserts or athletic shoes with air or gel sole cushions may be helpful.  If problems continue or become more severe, consult a sports medicine caregiver or your own health care provider. Cortisone is a potent anti-inflammatory medication that may be injected into the painful area. You can discuss this treatment with your caregiver. MAKE SURE YOU:   Understand these instructions.  Will watch your condition.  Will get help right away if you are not doing well or get worse. Document Released: 08/18/2001 Document Revised: 02/15/2012 Document Reviewed: 10/17/2008 [facility] Patient Information 2015 [place], [place]. This information is not intended to replace advice given to you by your health care provider. Make sure you discuss any questions you have with your health care provider.

## 2014-06-15 NOTE — Progress Notes (Signed)
[facility] [address], [place] [address] [phone]  Date:  06/15/2014   Name:  Cheryl Mckee   DOB:  19-Oct-1956   MRN:  [id number]  PCP:  Georgianne Fick, MD    Chief Complaint: Foot pain and Rash   History of Present Illness:  Cheryl Mckee is a 58 y.o. very pleasant female patient who presents with the following:  Has pain in the right foot this week.  No history of injury or overuse.  Sits at work.  Says the pain is worse in the morning when she arises and worse after sitting for prolonged time.  No history of gout.  Pain lessens during the day while she is up.   Has recurrent candidal intertrigo. Requests retreatment.  NIDDM non compliant.  Does not test her sugar.  Non smoker OSA non compliant with use of CPAP.  Chronic tinea pedis and onychomycosis. No improvement with over the counter medications or other home remedies. Denies other complaint or health concern today.   Patient Active Problem List   Diagnosis Date Noted  . Diabetes mellitus without complication   . Depression   . Hypertension   . Cirrhosis, biliary   . Fibromyalgia     Past Medical History  Diagnosis Date  . Cirrhosis, biliary   . Fibromyalgia   . Diabetes mellitus without complication   . Depression   . Hypertension   . Asthma     Past Surgical History  Procedure Laterality Date  . Foot surgery    . Knee surgery    . Cardiac catheterization    . Colonoscopy  10/05/12    History  Substance Use Topics  . Smoking status: Never Smoker   . Smokeless tobacco: Not on file  . Alcohol Use: No    Family History  Problem Relation Age of Onset  . Diabetes Mother   . Hypertension Mother   . Diabetes Father   . Cancer Maternal Aunt     Ovarian or Uterine  . Breast cancer Cousin     Maternal 1st cousin-Age 73    Allergies  Allergen Reactions  . Accupril [Quinapril Hcl]   . Daypro [Oxaprozin] Other (See Comments)    GI upset  . Peanut-Containing Drug  Products     NUT Allergy  . Zithromax [Azithromycin] Hives    Medication list has been reviewed and updated.  Current Outpatient Prescriptions on File Prior to Visit  Medication Sig Dispense Refill  . atorvastatin (LIPITOR) 40 MG tablet Take 40 mg by mouth daily.      . Cyclobenzaprine HCl (FLEXERIL PO) Take by mouth.      . Insulin Aspart (NOVOLOG [place]) Inject into the skin.      Marland Kitchen insulin glargine (LANTUS) 100 UNIT/ML injection Inject into the skin at bedtime.      . medroxyPROGESTERone (PROVERA) 10 MG tablet Take 1 tablet (10 mg total) by mouth daily.  12 tablet  6  . METFORMIN HCL PO Take 500 mg by mouth.       . nystatin ointment (MYCOSTATIN) Apply 1 application topically 2 (two) times daily. Apply to breasts and chin  30 g  0  . PRAVASTATIN SODIUM PO Take by mouth.      . predniSONE (DELTASONE) 20 MG tablet Take 2 tablets (40 mg total) by mouth daily with breakfast. X 7 days  14 tablet  0  . TraMADol HCl (ULTRAM PO) Take by mouth.      Marland Kitchen  traZODone (DESYREL) 50 MG tablet Take 50 mg by mouth at bedtime.      Marland Kitchen. lisinopril (PRINIVIL,ZESTRIL) 40 MG tablet Take 40 mg by mouth daily.       No current facility-administered medications on file prior to visit.    Review of Systems:  As per HPI, otherwise negative.    Physical Examination: Filed Vitals:   06/15/14 1453  BP: 138/88  Pulse: 84  Temp: 97.9 F (36.6 C)  Resp: 18   Filed Vitals:   06/15/14 1453  Height: 5\' 9"  (1.753 m)  Weight: 313 lb 12.8 oz (142.339 kg)   Body mass index is 46.32 kg/(m^2). Ideal Body Weight: Weight in (lb) to have BMI = 25: 168.9  GEN: WDWN, NAD, Non-toxic, A & O x 3 HEENT: Atraumatic, Normocephalic. Neck supple. No masses, No LAD. Ears and Nose: No external deformity. CV: RRR, No M/G/R. No JVD. No thrill. No extra heart sounds. PULM: CTA B, no wheezes, crackles, rhonchi. No retractions. No resp. distress. No accessory muscle use. ABD: S, NT, ND, +BS. No rebound. No HSM. EXTR: No c/c/e.   Tinea pedis, onychomycosis.  Tender plantar right foot just forward of heel. NEURO Normal gait.  PSYCH: Normally interactive. Conversant. Not depressed or anxious appearing.  Calm demeanor.  SKIN:  Candidal intertrigo  Assessment and Plan: Candidal intertrigo Diflucan Powder OSA non compliant CPAP NIDDM non complaint Check sugar daily HBA1C Onychomycosis Morbid obesity   Signed,  Phillips OdorJeffery Anderson, MD  [facility] reading (PRIMARY) by  Dr. Dareen PianoAnderson.  Heel spur.  Results for orders placed in visit on 06/15/14  POCT GLYCOSYLATED HEMOGLOBIN (HGB A1C)      Result Value Ref Range   Hemoglobin A1C 8.7

## 2014-06-19 ENCOUNTER — Ambulatory Visit: Payer: BC Managed Care – PPO | Admitting: Neurology

## 2014-06-27 ENCOUNTER — Telehealth: Payer: Self-pay

## 2014-06-27 NOTE — Telephone Encounter (Signed)
Dr.Anderson, Pt states that her foot is really swollen and she is unable to wear her normal shoes she is having to wear flip flops, her job requires that she wear closed back shoes but she unable to at this time until her foot heals. Pt would like a note stating that she can wear her flip flops at work until the swelling goes away.    Best# (work) [phone] or (home) [phone]

## 2014-06-27 NOTE — Telephone Encounter (Signed)
please advise.

## 2014-06-28 NOTE — Telephone Encounter (Signed)
No she needs shoes with arch support.  Flip flops are not suitable.

## 2014-06-29 NOTE — Telephone Encounter (Signed)
Spoke to patient advised shoe needed. Pt will try to find something that will work and is comfortable.

## 2014-07-24 ENCOUNTER — Telehealth: Payer: Self-pay | Admitting: *Deleted

## 2014-07-24 NOTE — Telephone Encounter (Signed)
error 

## 2014-07-31 ENCOUNTER — Ambulatory Visit: Payer: BC Managed Care – PPO | Admitting: Neurology

## 2014-09-05 ENCOUNTER — Encounter: Payer: Self-pay | Admitting: *Deleted

## 2014-09-06 ENCOUNTER — Ambulatory Visit (INDEPENDENT_AMBULATORY_CARE_PROVIDER_SITE_OTHER): Payer: BC Managed Care – PPO | Admitting: Neurology

## 2014-09-06 ENCOUNTER — Encounter: Payer: Self-pay | Admitting: Neurology

## 2014-09-06 VITALS — BP 130/90 | HR 80 | Ht 69.0 in | Wt 308.4 lb

## 2014-09-06 DIAGNOSIS — M6530 Trigger finger, unspecified finger: Secondary | ICD-10-CM

## 2014-09-06 DIAGNOSIS — G5603 Carpal tunnel syndrome, bilateral upper limbs: Secondary | ICD-10-CM

## 2014-09-06 DIAGNOSIS — G5602 Carpal tunnel syndrome, left upper limb: Secondary | ICD-10-CM

## 2014-09-06 DIAGNOSIS — M79642 Pain in left hand: Secondary | ICD-10-CM

## 2014-09-06 DIAGNOSIS — G5601 Carpal tunnel syndrome, right upper limb: Secondary | ICD-10-CM

## 2014-09-06 DIAGNOSIS — M79641 Pain in right hand: Secondary | ICD-10-CM

## 2014-09-06 NOTE — Progress Notes (Signed)
[facility] - [facility]   Date: 09/06/2014  Cheryl Mckee MRN: [id number] DOB: 08/11/1956   Dear Dr:  Nicholos Johnsamachandran:  Thank you for your kind referral of Cheryl Mckee for consultation of bilateral hand pain and weakness. Although her history is well known to you, please allow us to reiterate it for the purpose of our medical record. The patient was accompanied to the clinic by self    History of Present Illness: Cheryl Mckee is a 58 y.o. right-handed African American female with history of diabetes mellitus (HbA1c 8.7, diagnosed 2000), hypertension, depression, hypertension, and fibromyalgia presenting for evaluation of bilateral hand pain.  Starting around late 2014, she developed pain achy pain of the fingers, especially when lifting objects.  Her grip has become weaker, but she is not dropping things.  She denies numbness/tingling or neck pain.   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 Later during the summer, she noticed that her left thumb gets stuck at times when trying to flex it.  Sometimes, she has to manually extend the thumb open.  She reports injuring her left wrist in 2014 when she was lifting a box spring, but never sought medical attention because it slowly improved.  She uses a wrist splint which helps both her wrist pain and achy pain.  She does not wake up at night with hand discomfort.    Out-side paper records, electronic medical record, and images have been reviewed where available and summarized as:  Lab Results  Component Value Date   HGBA1C 8.7 06/15/2014   CT head 06/01/2006:  Negative  Past Medical History  Diagnosis Date  . Cirrhosis, biliary   . Fibromyalgia   . Diabetes mellitus without complication   . Depression   . Hypertension   . Asthma     Past Surgical History  Procedure Laterality Date  . Foot surgery    . Knee surgery    . Cardiac catheterization    . Colonoscopy   10/05/12     Medications:  Current Outpatient Prescriptions on File Prior to Visit  Medication Sig Dispense Refill  . atorvastatin (LIPITOR) 40 MG tablet Take 40 mg by mouth daily.      . Insulin Aspart (NOVOLOG [place]) Inject into the skin.      Marland Kitchen. insulin glargine (LANTUS) 100 UNIT/ML injection Inject into the skin at bedtime.      Marland Kitchen. METFORMIN HCL PO Take 500 mg by mouth.       . TraMADol HCl (ULTRAM PO) Take by mouth.      . traZODone (DESYREL) 50 MG tablet Take 50 mg by mouth at bedtime as needed.        No current facility-administered medications on file prior to visit.    Allergies:  Allergies  Allergen Reactions  . Accupril [Quinapril Hcl]   . Daypro [Oxaprozin] Other (See Comments)    GI upset  . Peanut-Containing Drug Products     NUT Allergy  . Zithromax [Azithromycin] Hives    Family History: Family History  Problem Relation Age of Onset  . Diabetes Mother   . Hypertension Mother   . Diabetes Father   . Cancer Maternal Aunt     Ovarian or Uterine  . Breast cancer Cousin     Maternal 1st cousin-Age 39  . Cancer Father     Deceased, 3970s    Social History: History   Social History  . Marital Status: Married  Spouse Name: N/A    Number of Children: N/A  . Years of Education: N/A   Occupational History  . Not on file.   Social History Main Topics  . Smoking status: Never Smoker   . Smokeless tobacco: Not on file  . Alcohol Use: No  . Drug Use: No  . Sexual Activity: Yes    Birth Control/ Protection: None   Other Topics Concern  . Not on file   Social History Narrative   Lives with husband in one story home.  No children.   BS degree.   Customer Service at [employer].             Review of Systems:  CONSTITUTIONAL: No fevers, chills, night sweats, or weight loss.   EYES: No visual changes or eye pain ENT: No hearing changes.  No history of nose bleeds.   RESPIRATORY: No cough, wheezing and shortness of breath.     CARDIOVASCULAR: Negative for chest pain, and palpitations.   GI: Negative for abdominal discomfort, blood in stools or black stools.  No recent change in bowel habits.   GU:  No history of incontinence.   MUSCLOSKELETAL: +history of joint pain or swelling.  +myalgias.   SKIN: Negative for lesions, rash, and itching.   HEMATOLOGY/ONCOLOGY: Negative for prolonged bleeding, bruising easily, and swollen nodes.    ENDOCRINE: Negative for cold or heat intolerance, polydipsia or goiter.   PSYCH:  No depression or anxiety symptoms.   NEURO: As Above.   Vital Signs:  BP 130/90  Pulse 80  Ht 5\' 9"  (1.753 m)  Wt 308 lb 6 oz (139.878 kg)  BMI 45.52 kg/m2  SpO2 99%   General Medical Exam:   General:  Well appearing, comfortable.   Eyes/ENT: see cranial nerve examination.   Neck: No masses appreciated.  Full range of motion without tenderness.   Respiratory:  Clear to auscultation, good air entry bilaterally.   Cardiac:  Regular rate and rhythm, no murmur.     Skin:  Skin color, texture, turgor normal. No rashes or lesions.  Neurological Exam: MENTAL STATUS including orientation to time, place, person, recent and remote memory, attention span and concentration, language, and fund of knowledge is normal.  Speech is not dysarthric.  CRANIAL NERVES: II:  No visual field defects.  Unremarkable fundi.   III-IV-VI: Pupils equal round and reactive to light.  Normal conjugate, extra-ocular eye movements in all directions of gaze.  No nystagmus.  No ptosis.   V:  Normal facial sensation.     VII:  Normal facial symmetry and movements.  No pathologic facial reflexes.  VIII:  Normal hearing and vestibular function.   IX-X:  Normal palatal movement.   XI:  Normal shoulder shrug and head rotation.   XII:  Normal tongue strength and range of motion, no deviation or fasciculation.  MOTOR:  No atrophy, fasciculations or abnormal movements.  No pronator drift.  Tone is normal.  No pain with palpation over  the DIP and PIP joints. Tinel's negative at the wrist bilaterally.   Right Upper Extremity:    Left Upper Extremity:    Deltoid  5/5   Deltoid  5/5   Biceps  5/5   Biceps  5/5   Triceps  5/5   Triceps  5/5   Wrist extensors  5/5   Wrist extensors  5/5   Wrist flexors  5/5   Wrist flexors  5/5   Finger extensors  5/5   Finger  extensors  5/5   Finger flexors  5/5   Finger flexors  5/5   Dorsal interossei  5/5   Dorsal interossei  5/5   Abductor pollicis  5-/5   Abductor pollicis  5-/5   Tone (Ashworth scale)  0  Tone (Ashworth scale)  0   Right Lower Extremity:    Left Lower Extremity:    Hip flexors  5/5   Hip flexors  5/5   Hip extensors  5/5   Hip extensors  5/5   Knee flexors  5/5   Knee flexors  5/5   Knee extensors  5/5   Knee extensors  5/5   Dorsiflexors  5/5   Dorsiflexors  5/5   Plantarflexors  5/5   Plantarflexors  5/5   Toe extensors  5/5   Toe extensors  5/5   Toe flexors  5/5   Toe flexors  5/5   Tone (Ashworth scale)  0  Tone (Ashworth scale)  0   MSRs:  Right                                                                 Left brachioradialis 1+  brachioradialis 1+  biceps 1+  biceps 1+  triceps 1+  triceps 1+  patellar 1+  patellar 1+  ankle jerk 0  ankle jerk 0  Hoffman no  Hoffman no  plantar response down  plantar response down   SENSORY:  Normal and symmetric perception of light touch, pinprick, vibration, and proprioception.  Romberg's sign absent.   COORDINATION/GAIT: Normal finger-to- nose-finger and heel-to-shin.  Intact rapid alternating movements bilaterally.  Able to rise from a chair without using arms.  Gait narrow based and stable. Unsteady with heel walking, but tandem and toe walking intact.   IMPRESSION/PLAN: Mrs. Neault is a 58 year-old female presenting for evaluation of bilateral hand pain and discomfort, which started late 2014.  Her neurological exam shows very mild weakness of bilateral abductor pollicis muscles, otherwise her exam is  normal.  Given the absence of typical hand paresthesias (numbness/tingling), my suspicion for CTS is low but cannot be excluded since there is subtle weakness of median-innervated hand muscles.   Other possibilities include arthritis and/or pain related to fibromylagia especially since she complains of a lot of achy joint pain.    I will obtain NCS/EMG of the upper extremities to better clarify the nature of her symptoms.  In the meantime, I recommended that she start using a wrist splint to both hands to see if this will help.  I also encouraged her to maintain tight glycemic control to prevent neuropathy as well as other complications related to diabetes.  Return to clinic in 58-months, or sooner as needed   The duration of this appointment visit was 40 minutes of face-to-face time with the patient.  Greater than 50% of this time was spent in counseling, explanation of diagnosis, planning of further management, and coordination of care.   Thank you for allowing me to participate in patient's care.  If I can answer any additional questions, I would be pleased to do so.    Sincerely,    Kyvon Hu K. Allena Katz, DO

## 2014-09-06 NOTE — Progress Notes (Signed)
Note faxed.

## 2014-09-06 NOTE — Patient Instructions (Signed)
EMG of the hands bilaterally Start using a wrist splint If symptoms improve, please call us and we can cancel the EMG Otherwise, I'll see you back in 493-months

## 2014-11-05 ENCOUNTER — Encounter: Payer: BC Managed Care – PPO | Admitting: Neurology

## 2014-11-06 ENCOUNTER — Emergency Department (HOSPITAL_COMMUNITY)
Admission: EM | Admit: 2014-11-06 | Discharge: 2014-11-06 | Disposition: A | Discharge status: Home / Self Care | Payer: BC Managed Care – PPO | Attending: Emergency Medicine | Admitting: Emergency Medicine

## 2014-11-06 ENCOUNTER — Emergency Department (HOSPITAL_COMMUNITY): Payer: BC Managed Care – PPO

## 2014-11-06 ENCOUNTER — Encounter (HOSPITAL_COMMUNITY): Payer: Self-pay | Admitting: *Deleted

## 2014-11-06 ENCOUNTER — Ambulatory Visit (INDEPENDENT_AMBULATORY_CARE_PROVIDER_SITE_OTHER): Payer: BC Managed Care – PPO | Admitting: Sports Medicine

## 2014-11-06 VITALS — BP 188/96 | HR 79 | Temp 98.1°F | Resp 18

## 2014-11-06 DIAGNOSIS — R748 Abnormal levels of other serum enzymes: Secondary | ICD-10-CM

## 2014-11-06 DIAGNOSIS — F329 Major depressive disorder, single episode, unspecified: Secondary | ICD-10-CM | POA: Diagnosis not present

## 2014-11-06 DIAGNOSIS — Z79899 Other long term (current) drug therapy: Secondary | ICD-10-CM | POA: Diagnosis not present

## 2014-11-06 DIAGNOSIS — R079 Chest pain, unspecified: Secondary | ICD-10-CM | POA: Diagnosis present

## 2014-11-06 DIAGNOSIS — M797 Fibromyalgia: Secondary | ICD-10-CM | POA: Insufficient documentation

## 2014-11-06 DIAGNOSIS — Z794 Long term (current) use of insulin: Secondary | ICD-10-CM | POA: Insufficient documentation

## 2014-11-06 DIAGNOSIS — I10 Essential (primary) hypertension: Secondary | ICD-10-CM | POA: Diagnosis not present

## 2014-11-06 DIAGNOSIS — R0789 Other chest pain: Secondary | ICD-10-CM

## 2014-11-06 DIAGNOSIS — Z8719 Personal history of other diseases of the digestive system: Secondary | ICD-10-CM | POA: Insufficient documentation

## 2014-11-06 DIAGNOSIS — E119 Type 2 diabetes mellitus without complications: Secondary | ICD-10-CM | POA: Insufficient documentation

## 2014-11-06 DIAGNOSIS — J45909 Unspecified asthma, uncomplicated: Secondary | ICD-10-CM | POA: Insufficient documentation

## 2014-11-06 LAB — HEPATIC FUNCTION PANEL
ALT: 26 U/L (ref 0–35)
AST: 22 U/L (ref 0–37)
Albumin: 3.2 g/dL — ABNORMAL LOW (ref 3.5–5.2)
Alkaline Phosphatase: 141 U/L — ABNORMAL HIGH (ref 39–117)
Total Protein: 7.6 g/dL (ref 6.0–8.3)

## 2014-11-06 LAB — CBC
HCT: 40.1 % (ref 36.0–46.0)
Hemoglobin: 12.6 g/dL (ref 12.0–15.0)
MCH: 24.6 pg — AB (ref 26.0–34.0)
MCHC: 31.4 g/dL (ref 30.0–36.0)
MCV: 78.3 fL (ref 78.0–100.0)
PLATELETS: 244 10*3/uL (ref 150–400)
RBC: 5.12 MIL/uL — AB (ref 3.87–5.11)
RDW: 14.6 % (ref 11.5–15.5)
WBC: 9.4 10*3/uL (ref 4.0–10.5)

## 2014-11-06 LAB — BASIC METABOLIC PANEL
ANION GAP: 12 (ref 5–15)
BUN: 13 mg/dL (ref 6–23)
CALCIUM: 9.2 mg/dL (ref 8.4–10.5)
CO2: 25 mEq/L (ref 19–32)
Chloride: 99 mEq/L (ref 96–112)
Creatinine, Ser: 0.73 mg/dL (ref 0.50–1.10)
GFR calc non Af Amer: >90 mL/min (ref >90)
Glucose, Bld: 127 mg/dL — ABNORMAL HIGH (ref 70–99)
Potassium: 4.4 mEq/L (ref 3.7–5.3)
SODIUM: 136 meq/L — AB (ref 137–147)

## 2014-11-06 LAB — I-STAT TROPONIN, ED: Troponin i, poc: 0 ng/mL (ref 0.00–0.08)

## 2014-11-06 LAB — LIPASE, BLOOD: Lipase: 60 U/L — ABNORMAL HIGH (ref 11–59)

## 2014-11-06 MED ORDER — ASPIRIN 81 MG PO CHEW
324.0000 mg | CHEWABLE_TABLET | Freq: Once | ORAL | Status: AC
Start: 2014-11-06 — End: 2014-11-06
  Administered 2014-11-06: 324 mg via ORAL

## 2014-11-06 MED ORDER — NITROGLYCERIN 0.4 MG SL SUBL: 0.4000 mg | SUBLINGUAL_TABLET | SUBLINGUAL | Status: DC | PRN

## 2014-11-06 MED ORDER — NITROGLYCERIN 0.4 MG SL SUBL
0.4000 mg | SUBLINGUAL_TABLET | Freq: Once | SUBLINGUAL | Status: AC
  Administered 2014-11-06: 0.4 mg via SUBLINGUAL

## 2014-11-06 NOTE — Discharge Instructions (Signed)
As discussed, your evaluation today has been largely reassuring.  But, it is important that you monitor your condition carefully, and do not hesitate to return to the ED if you develop new, or concerning changes in your condition.  The elevation in your serum lipase level likely reflects mild pancreatitis.  As instructed, it is important that you have a clear liquid diet tomorrow, and then slowly reintroduce your typical foods back into your diet.  Otherwise, please follow-up with your physician for appropriate ongoing care.

## 2014-11-06 NOTE — ED Notes (Signed)
Pt remains monitored by blood pressure, pulse ox, and 5 lead. pts family remains at bedside.  

## 2014-11-06 NOTE — Progress Notes (Signed)
  Cheryl BalesSharon A Mckee - 58 y.o. female MRN [id number]  Date of birth: 06/02/1956  CC & HPI:  Chief Complaint  Patient presents with  . Chest Pain    started around 3 pm and having back pain and nausea  . Nausea  . Back Pain    upper left back hurting with the chest pain  . Fatigue    Patient presents for evaluation of: Central chest pain radiating to her back, nausea, fatigue, dyspnea: Patient reports an acute onset of midsternal chest pain that is radiating to her back. It began following lunch at 3 PM (approximately 5 hours ago). She went and laid down and was unable to get comfortable, was experiencing diaphoresis, right arm pain, shortness of breath.  She's had a prior episode of this many years ago and underwent cardiac catheterization at that time that was essentially normal and she was diagnosed with costochondritis. She denies any recent changes in functional status. She reports occasional lower extremity swelling, unchanged over the past 1-2 weeks, occasional PND, no orthopnea. She does have significantly disordered sleep and sleep apnea but is noncompliant with CPAP.  She has history of fibromyalgia and has been on Celebrex daily for the past 3 weeks otherwise no other medication changes. Her blood pressure is occasionally somewhat elevated but never as high as it was today at triage.  ROS:  Per HPI.   HISTORY: Past Medical, Surgical, Social, and Family History Reviewed & Updated per EMR.  Pertinent Historical Findings include: Diabetes, hypertension, morbid obesity, fibromyalgia, biliary cirrhosis, depression prior reportedly normal cardiac catheterization, records not available Non-smoker  Historical Data Reviewed: Hemoglobin A1c 06/15/2014 = 8.7   OBJECTIVE:  VS:   HT:    WT:   BMI:           BP:(!) 188/96 mmHg  HR:79bpm  TEMP:98.1 F (36.7 C)(Oral)  RESP:100 %  PHYSICAL EXAM: GENERAL:  adult morbidly obese African-American female. In no discomfort; no respiratory  distress   PSYCH: alert and appropriate, good insight   HNEENT: mmm, no JVD, prominent carotid upstroke. No significant hepatojugular reflux   CARDIAC: RRR, S1/S2 heard, no murmur  LUNGS: CTA B, no wheezes, no crackles  ABDOMEN:  soft, nontender, morbidly obese   EXTREM: Warm, well perfused.  Moves all 4 extremities spontaneously; no lateralization.  Pedal pulses 2+/4.  2+ pretibial edema.    EKG: 11/06/2014 Rate & Rhythm:  sinus rhythm at 80 bpm   Axis:   normal   Intervals:   normal   Other:  overall poor R-wave progression but no significant abnormalities      ASSESSMENT: 1. Other chest pain    Concern for NSTEMI. Multiple risk factors including poorly controlled DM, HTN, Obesity, NSAID use, OSA. Unknown if HLD. Possibly exacerbated by new Celebrex use. Would recommend discontinuing this medication. >50% of this 25 minute visit spent in direct patient counseling and/or coordination of care.   PLAN: See problem based charting & AVS for additional documentation.  325 mg aspirin provided  Sublingual nitroglycerin provided - reports some improvement in midsternal pain with NITRO  [facility] for further risk stratification and admission for ACS rule out/risk stratification.  Defer further evaluation to ED

## 2014-11-06 NOTE — ED Provider Notes (Addendum)
CSN: [id number]     Arrival date & time 11/06/14  2034 History   First MD Initiated Contact with Patient 11/06/14 2040     Chief Complaint  Patient presents with  . Chest Pain     HPI  Patient presents after an episode of chest pain.  On my evaluation she has no complaints. She states that approximately 8 hours prior to my evaluation patient episode of epigastric pain radiating to the back.  There was no nausea, vomiting, sick, near syncope. There is a brief period of right arm dysesthesia, brief, without clear precipitant, and without weakness. No clear alleviating factors. Patient notes that she has had similar events over the past few years. Patient has had catheterization approximately 10 years ago, without intervention. Patient acknowledges a history of fibromyalgia, in addition to diabetes, hypertension. The pain is not pleuritic nor exertional  Past Medical History  Diagnosis Date  . Cirrhosis, biliary   . Fibromyalgia   . Diabetes mellitus without complication   . Depression   . Hypertension   . Asthma    Past Surgical History  Procedure Laterality Date  . Foot surgery    . Knee surgery    . Cardiac catheterization    . Colonoscopy  10/05/12  . Cardiac catheterization     Family History  Problem Relation Age of Onset  . Diabetes Mother   . Hypertension Mother   . Diabetes Father   . Cancer Maternal Aunt     Ovarian or Uterine  . Breast cancer Cousin     Maternal 1st cousin-Age 67  . Cancer Father     Deceased, 2170s   History  Substance Use Topics  . Smoking status: Never Smoker   . Smokeless tobacco: Not on file  . Alcohol Use: No   OB History    Gravida Para Term Preterm AB TAB SAB Ectopic Multiple Living   0              Review of Systems  Constitutional:       Per HPI, otherwise negative  HENT:       Per HPI, otherwise negative  Respiratory:       Per HPI, otherwise negative  Cardiovascular:       Per HPI, otherwise negative   Gastrointestinal: Negative for vomiting.  Endocrine:       Negative aside from HPI  Genitourinary:       Neg aside from HPI   Musculoskeletal:       Per HPI, otherwise negative  Skin: Negative.   Neurological: Negative for syncope.      Allergies  Accupril; Daypro; Peanut-containing drug products; and Zithromax  Home Medications   Prior to Admission medications   Medication Sig Start Date End Date Taking? Authorizing Provider  atorvastatin (LIPITOR) 40 MG tablet Take 40 mg by mouth daily.   Yes Historical Provider, MD  celecoxib (CELEBREX) 200 MG capsule  10/08/14  Yes Historical Provider, MD  Insulin Aspart (NOVOLOG [place]) Inject 45 Units into the skin daily.    Yes Historical Provider, MD  insulin glargine (LANTUS) 100 UNIT/ML injection Inject 80 Units into the skin 2 (two) times daily.    Yes Historical Provider, MD  metFORMIN (GLUCOPHAGE) 500 MG tablet Take 500 mg by mouth 2 (two) times daily with a meal.   Yes Historical Provider, MD  traMADol (ULTRAM) 50 MG tablet Take 50 mg by mouth every 6 (six) hours as needed (pain).   Yes Historical Provider, MD  traZODone (DESYREL) 50 MG tablet Take 50 mg by mouth at bedtime as needed for sleep.    Yes Historical Provider, MD   BP 185/81 mmHg  Pulse 73  Temp(Src) 98.4 F (36.9 C) (Oral)  Resp 18  Ht 5\' 9"  (1.753 m)  Wt 300 lb (136.079 kg)  BMI 44.28 kg/m2  SpO2 99%  LMP 10/29/2014 Physical Exam  Constitutional: She is oriented to person, place, and time. She appears well-developed and well-nourished. No distress.  HENT:  Head: Normocephalic and atraumatic.  Eyes: Conjunctivae and EOM are normal.  Cardiovascular: Normal rate and regular rhythm.   Pulmonary/Chest: Effort normal and breath sounds normal. No stridor. No respiratory distress.  Abdominal: She exhibits no distension.  Musculoskeletal: She exhibits no edema.  Neurological: She is alert and oriented to person, place, and time. She displays no atrophy and no tremor. No  cranial nerve deficit or sensory deficit. She exhibits normal muscle tone. She displays no seizure activity. Coordination normal.  Skin: Skin is warm and dry.  Psychiatric: She has a normal mood and affect.  Nursing note and vitals reviewed.   ED Course  Procedures (including critical care time) Labs Review Labs Reviewed  CBC - Abnormal; Notable for the following:    RBC 5.12 (*)    MCH 24.6 (*)    All other components within normal limits  BASIC METABOLIC PANEL - Abnormal; Notable for the following:    Sodium 136 (*)    Glucose, Bld 127 (*)    All other components within normal limits  HEPATIC FUNCTION PANEL - Abnormal; Notable for the following:    Albumin 3.2 (*)    Alkaline Phosphatase 141 (*)    Total Bilirubin <0.2 (*)    All other components within normal limits  LIPASE, BLOOD - Abnormal; Notable for the following:    Lipase 60 (*)    All other components within normal limits  I-STAT TROPOININ, ED    Imaging Review Dg Chest 2 View  11/06/2014   CLINICAL DATA:  58 year old female with chest pain and shortness of breath. Initial encounter.  EXAM: CHEST  2 VIEW  COMPARISON:  None.  FINDINGS: Mild cardiomegaly noted.  There is no evidence of focal airspace disease, pulmonary edema, suspicious pulmonary nodule/mass, pleural effusion, or pneumothorax. No acute bony abnormalities are identified.  IMPRESSION: Mild cardiomegaly without evidence of active cardiopulmonary disease.   Electronically Signed   By: Laveda AbbeJeff  Hu M.D.   On: 11/06/2014 23:23   EKG with sinus rhythm, rate 70, artifact, otherwise unremarkable  11:32 PM  on repeat exam the patient is awake and alert, smiling.  She and her husband are aware of all results, including elevated lipase.  She adds that her epigastric pain began right after eating lunch. She will arrange follow-up with GI.  She will have clear liquid diet tomorrow, slow increase in oral intake thereafter.    MDM   Final diagnoses:  Chest pain    elevated lipase   this patient presents after an episode of epigastric pain, and is a symptomatically on my evaluation.  Given the passage time, the negative troponin, unremarkable EKG I reassured the low suspicion of coronary ischemia. Patient's evaluation is most consistent with mild pancreatitis.  The absence of symptoms throughout hours of monitoring, with reassuring findings, she was discharged in stable condition with diet, return precautions, follow-up with GI.   Gerhard Munchobert Pape Parson, MD 11/06/14 [phone]  Gerhard Munchobert Haron Beilke, MD 11/06/14 [phone]

## 2014-11-06 NOTE — ED Notes (Signed)
Pt is a transfer pt from [facility]. Pt was at work today and felt  Substernal chest pressure, radiating to back  around 3pm associated with increased shortness of breath. Pt given 325mg  ASA and Nitro x 2 by [facility]. Pt reports similar pain in the past in which she had a cardiac cath. Pt is pain free at this time after two nitro

## 2014-11-06 NOTE — ED Notes (Signed)
Pt placed on monitor upon return to room from restroom. Pt remains monitored by blood pressure, pulse ox, and 5 lead. pts family remains at bedside.  

## 2014-12-12 ENCOUNTER — Ambulatory Visit: Payer: BC Managed Care – PPO | Admitting: Neurology

## 2014-12-12 DIAGNOSIS — Z029 Encounter for administrative examinations, unspecified: Secondary | ICD-10-CM

## 2014-12-13 ENCOUNTER — Encounter: Payer: Self-pay | Admitting: *Deleted

## 2014-12-13 ENCOUNTER — Telehealth: Payer: Self-pay | Admitting: Neurology

## 2014-12-13 NOTE — Telephone Encounter (Signed)
Pt no showed 12/12/14 appt w/ Dr. Allena KatzPatel.  Erica - please send no show letter + copy of no show policy / Sherri S.

## 2014-12-13 NOTE — Progress Notes (Signed)
No show letters sent for 12/12/2014 

## 2015-07-18 ENCOUNTER — Ambulatory Visit (INDEPENDENT_AMBULATORY_CARE_PROVIDER_SITE_OTHER): Payer: BLUE CROSS/BLUE SHIELD | Admitting: Gynecology

## 2015-07-18 ENCOUNTER — Encounter: Payer: Self-pay | Admitting: Gynecology

## 2015-07-18 ENCOUNTER — Other Ambulatory Visit (HOSPITAL_COMMUNITY)
Admission: RE | Admit: 2015-07-18 | Discharge: 2015-07-18 | Disposition: A | Discharge status: Home / Self Care | Payer: BLUE CROSS/BLUE SHIELD | Source: Ambulatory Visit | Attending: Gynecology | Admitting: Gynecology

## 2015-07-18 ENCOUNTER — Other Ambulatory Visit: Payer: Self-pay | Admitting: Gynecology

## 2015-07-18 VITALS — BP 130/80 | Ht 69.0 in | Wt 297.0 lb

## 2015-07-18 DIAGNOSIS — N912 Amenorrhea, unspecified: Secondary | ICD-10-CM

## 2015-07-18 DIAGNOSIS — N361 Urethral diverticulum: Secondary | ICD-10-CM | POA: Diagnosis not present

## 2015-07-18 DIAGNOSIS — Z01419 Encounter for gynecological examination (general) (routine) without abnormal findings: Secondary | ICD-10-CM

## 2015-07-18 LAB — FOLLICLE STIMULATING HORMONE: FSH: 9.3 m[IU]/mL

## 2015-07-18 NOTE — Addendum Note (Signed)
Addended by: Dayna Barker on: 07/18/2015 09:11 AM   Modules accepted: Orders

## 2015-07-18 NOTE — Patient Instructions (Signed)
Office will call you with the hormone results.  Call to Schedule your mammogram  Facilities in [place]: 1)  [facility], [address] Phone: [phone] 2)  [facility]. [address] [place] Phone: [phone] 3)  Dr. Isaiah Blakes at [facility] [address] Phone: [phone]     Mammogram A mammogram is an X-ray test to find changes in a woman's breast. You should get a mammogram if:  You are 58 years of age or older  You have risk factors.   Your doctor recommends that you have one.  BEFORE THE TEST  Do not schedule the test the week before your period, especially if your breasts are sore during this time.  On the day of your mammogram:  Wash your breasts and armpits well. After washing, do not put on any deodorant or talcum powder on until after your test.   Eat and drink as you usually do.   Take your medicines as usual.   If you are diabetic and take insulin, make sure you:   Eat before coming for your test.   Take your insulin as usual.   If you cannot keep your appointment, call before the appointment to cancel. Schedule another appointment.  TEST  You will need to undress from the waist up. You will put on a hospital gown.   Your breast will be put on the mammogram machine, and it will press firmly on your breast with a piece of plastic called a compression paddle. This will make your breast flatter so that the machine can X-ray all parts of your breast.   Both breasts will be X-rayed. Each breast will be X-rayed from above and from the side. An X-ray might need to be taken again if the picture is not good enough.   The mammogram will last about 15 to 30 minutes.  AFTER THE TEST Finding out the results of your test Ask when your test results will be ready. Make sure you get your test results.  Document Released: 02/19/2009 Document Revised: 11/12/2011  Document Reviewed: 02/19/2009 [facility] Patient Information 2012 [place].  You may obtain a copy of any labs that were done today by logging onto MyChart as outlined in the instructions provided with your AVS (after visit summary). The office will not call with normal lab results but certainly if there are any significant abnormalities then we will contact you.   Health Maintenance, Female A healthy lifestyle and preventative care can promote health and wellness.  Maintain regular health, dental, and eye exams.  Eat a healthy diet. Foods like vegetables, fruits, whole grains, low-fat dairy products, and lean protein foods contain the nutrients you need without too many calories. Decrease your intake of foods high in solid fats, added sugars, and salt. Get information about a proper diet from your caregiver, if necessary.  Regular physical exercise is one of the most important things you can do for your health. Most adults should get at least 150 minutes of moderate-intensity exercise (any activity that increases your heart rate and causes you to sweat) each week. In addition, most adults need muscle-strengthening exercises on 2 or more days a week.   Maintain a healthy weight. The body mass index (BMI) is a screening tool to identify possible weight problems. It provides an estimate of body fat based on height and weight. Your caregiver can help determine your BMI, and can help  you achieve or maintain a healthy weight. For adults 20 years and older:  A BMI below 18.5 is considered underweight.  A BMI of 18.5 to 24.9 is normal.  A BMI of 25 to 29.9 is considered overweight.  A BMI of 30 and above is considered obese.  Maintain normal blood lipids and cholesterol by exercising and minimizing your intake of saturated fat. Eat a balanced diet with plenty of fruits and vegetables. Blood tests for lipids and cholesterol should begin at age 20 and be repeated every 5 years. If your lipid or  cholesterol levels are high, you are over 50, or you are a high risk for heart disease, you may need your cholesterol levels checked more frequently.Ongoing high lipid and cholesterol levels should be treated with medicines if diet and exercise are not effective.  If you smoke, find out from your caregiver how to quit. If you do not use tobacco, do not start.  Lung cancer screening is recommended for adults aged 55 80 years who are at high risk for developing lung cancer because of a history of smoking. Yearly low-dose computed tomography (CT) is recommended for people who have at least a 30-pack-year history of smoking and are a current smoker or have quit within the past 15 years. A pack year of smoking is smoking an average of 1 pack of cigarettes a day for 1 year (for example: 1 pack a day for 30 years or 2 packs a day for 15 years). Yearly screening should continue until the smoker has stopped smoking for at least 15 years. Yearly screening should also be stopped for people who develop a health problem that would prevent them from having lung cancer treatment.  If you are pregnant, do not drink alcohol. If you are breastfeeding, be very cautious about drinking alcohol. If you are not pregnant and choose to drink alcohol, do not exceed 1 drink per day. One drink is considered to be 12 ounces (355 mL) of beer, 5 ounces (148 mL) of wine, or 1.5 ounces (44 mL) of liquor.  Avoid use of street drugs. Do not share needles with anyone. Ask for help if you need support or instructions about stopping the use of drugs.  High blood pressure causes heart disease and increases the risk of stroke. Blood pressure should be checked at least every 1 to 2 years. Ongoing high blood pressure should be treated with medicines, if weight loss and exercise are not effective.  If you are 55 to 58 years old, ask your caregiver if you should take aspirin to prevent strokes.  Diabetes screening involves taking a blood sample  to check your fasting blood sugar level. This should be done once every 3 years, after age 45, if you are within normal weight and without risk factors for diabetes. Testing should be considered at a younger age or be carried out more frequently if you are overweight and have at least 1 risk factor for diabetes.  Breast cancer screening is essential preventative care for women. You should practice "breast self-awareness." This means understanding the normal appearance and feel of your breasts and may include breast self-examination. Any changes detected, no matter how small, should be reported to a caregiver. Women in their 20s and 30s should have a clinical breast exam (CBE) by a caregiver as part of a regular health exam every 1 to 3 years. After age 40, women should have a CBE every year. Starting at age 40, women should consider having a   mammogram (breast X-ray) every year. Women who have a family history of breast cancer should talk to their caregiver about genetic screening. Women at a high risk of breast cancer should talk to their caregiver about having an MRI and a mammogram every year.  Breast cancer gene (BRCA)-related cancer risk assessment is recommended for women who have family members with BRCA-related cancers. BRCA-related cancers include breast, ovarian, tubal, and peritoneal cancers. Having family members with these cancers may be associated with an increased risk for harmful changes (mutations) in the breast cancer genes BRCA1 and BRCA2. Results of the assessment will determine the need for genetic counseling and BRCA1 and BRCA2 testing.  The Pap test is a screening test for cervical cancer. Women should have a Pap test starting at age 21. Between ages 21 and 29, Pap tests should be repeated every 2 years. Beginning at age 30, you should have a Pap test every 3 years as long as the past 3 Pap tests have been normal. If you had a hysterectomy for a problem that was not cancer or a condition  that could lead to cancer, then you no longer need Pap tests. If you are between ages 65 and 70, and you have had normal Pap tests going back 10 years, you no longer need Pap tests. If you have had past treatment for cervical cancer or a condition that could lead to cancer, you need Pap tests and screening for cancer for at least 20 years after your treatment. If Pap tests have been discontinued, risk factors (such as a new sexual partner) need to be reassessed to determine if screening should be resumed. Some women have medical problems that increase the chance of getting cervical cancer. In these cases, your caregiver may recommend more frequent screening and Pap tests.  The human papillomavirus (HPV) test is an additional test that may be used for cervical cancer screening. The HPV test looks for the virus that can cause the cell changes on the cervix. The cells collected during the Pap test can be tested for HPV. The HPV test could be used to screen women aged 30 years and older, and should be used in women of any age who have unclear Pap test results. After the age of 30, women should have HPV testing at the same frequency as a Pap test.  Colorectal cancer can be detected and often prevented. Most routine colorectal cancer screening begins at the age of 50 and continues through age 75. However, your caregiver may recommend screening at an earlier age if you have risk factors for colon cancer. On a yearly basis, your caregiver may provide home test kits to check for hidden blood in the stool. Use of a small camera at the end of a tube, to directly examine the colon (sigmoidoscopy or colonoscopy), can detect the earliest forms of colorectal cancer. Talk to your caregiver about this at age 50, when routine screening begins. Direct examination of the colon should be repeated every 5 to 10 years through age 75, unless early forms of pre-cancerous polyps or small growths are found.  Hepatitis C blood testing is  recommended for all people born from 1945 through 1965 and any individual with known risks for hepatitis C.  Practice safe sex. Use condoms and avoid high-risk sexual practices to reduce the spread of sexually transmitted infections (STIs). Sexually active women aged 25 and younger should be checked for Chlamydia, which is a common sexually transmitted infection. Older women with new or multiple   partners should also be tested for Chlamydia. Testing for other STIs is recommended if you are sexually active and at increased risk.  Osteoporosis is a disease in which the bones lose minerals and strength with aging. This can result in serious bone fractures. The risk of osteoporosis can be identified using a bone density scan. Women ages 37 and over and women at risk for fractures or osteoporosis should discuss screening with their caregivers. Ask your caregiver whether you should be taking a calcium supplement or vitamin D to reduce the rate of osteoporosis.  Menopause can be associated with physical symptoms and risks. Hormone replacement therapy is available to decrease symptoms and risks. You should talk to your caregiver about whether hormone replacement therapy is right for you.  Use sunscreen. Apply sunscreen liberally and repeatedly throughout the day. You should seek shade when your shadow is shorter than you. Protect yourself by wearing long sleeves, pants, a wide-brimmed hat, and sunglasses year round, whenever you are outdoors.  Notify your caregiver of new moles or changes in moles, especially if there is a change in shape or color. Also notify your caregiver if a mole is larger than the size of a pencil eraser.  Stay current with your immunizations. Document Released: 06/08/2011 Document Revised: 03/20/2013 Document Reviewed: 06/08/2011 [facility] Patient Information 2014 [place].

## 2015-07-18 NOTE — Progress Notes (Signed)
Cheryl Mckee 03-22-1956 [id number]        59 y.o.  G0P0 for annual exam.  Several issues noted below.  Past medical history,surgical history, problem list, medications, allergies, family history and social history were all reviewed and documented as reviewed in the EPIC chart.  ROS:  Performed with pertinent positives and negatives included in the history, assessment and plan.   Additional significant findings :  none   Exam: [facility] person Vitals:   07/18/15 0805  BP: 130/80  Height:  (1.753 m)  Weight: 297 lb (134.718 kg)   General appearance:  Normal affect, orientation and appearance. Skin: Grossly normal HEENT: Without gross lesions.  No cervical or supraclavicular adenopathy. Thyroid normal.  Lungs:  Clear without wheezing, rales or rhonchi Cardiac: RR, without RMG Abdominal:  Soft, nontender, without masses, guarding, rebound, organomegaly or hernia Breasts:  Examined lying and sitting without masses, retractions, discharge or axillary adenopathy. Pelvic:  Ext/BUS/vagina with 2 cm cystic feeling mass mid anterior vagina long urethra. Nontender  Cervix normal Pap smear done  Uterus difficult to palpate but no gross masses or tenderness  Adnexa  Without gross masses or tenderness    Anus and perineum  Normal   Rectovaginal  Normal sphincter tone without palpated masses or tenderness.    Assessment/Plan:  59 y.o. G0P0 female for annual exam without menses.   1. Amenorrhea since her Provera withdrawal last September. Had normal FSH. Recheck FSH now. Discussed with patient if [facility] elevated plan expectant management for any vaginal bleeding. If FSH is normal then recommend endometrial assessment rule out hyperplasia. Possibilities to include ultrasound and endometrial biopsy discussed. Will follow up [facility] results. 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 Wynelle Link with an MRI last year confirming a diverticulum. He recommended no intervention as she is asymptomatic.  It remains unchanged from my exam last year. She is having no pain or infections. Plan expectant management as recommended by urology. 3. Mammography overdue. Last mammogram 2014. Reminded patient she needs to schedule she agrees to do so.  4. Pap smear 2013. Pap smear done today. No history of significant abnormal Pap smears. 5. DEXA never. We'll plan further into the menopause. Increase calcium and vitamin D. 6. Colonoscopy 2011. Recommended repeat interval reported at 10 years. 7. Health maintenance. No routine blood work done as this is done at her primary physician's office. Follow up for [facility] results. Follow up in one year for annual exam.     Dara Lords MD, 8:44 AM 07/18/2015

## 2015-07-19 LAB — URINALYSIS W MICROSCOPIC + REFLEX CULTURE
Bacteria, UA: NONE SEEN [HPF]
Bilirubin Urine: NEGATIVE
CRYSTALS: NONE SEEN [HPF]
Casts: NONE SEEN [LPF]
Hgb urine dipstick: NEGATIVE
Nitrite: NEGATIVE
PH: 5.5 (ref 5.0–8.0)
Protein, ur: NEGATIVE
Specific Gravity, Urine: 1.028 (ref 1.001–1.035)
YEAST: NONE SEEN [HPF]

## 2015-07-19 LAB — CYTOLOGY - PAP

## 2015-07-22 ENCOUNTER — Other Ambulatory Visit: Payer: Self-pay | Admitting: Gynecology

## 2015-07-22 LAB — URINE CULTURE

## 2015-07-22 MED ORDER — SULFAMETHOXAZOLE-TRIMETHOPRIM 800-160 MG PO TABS: 1.0000 | ORAL_TABLET | Freq: Two times a day (BID) | ORAL | Status: DC

## 2015-07-23 ENCOUNTER — Other Ambulatory Visit: Payer: Self-pay | Admitting: Gynecology

## 2015-07-23 DIAGNOSIS — N912 Amenorrhea, unspecified: Secondary | ICD-10-CM

## 2015-07-31 ENCOUNTER — Telehealth: Payer: Self-pay | Admitting: Gynecology

## 2015-07-31 NOTE — Telephone Encounter (Signed)
07/31/15-I LM VM at home number that her insurance will cover the sonohysterogram under her $30.00 copay.wl

## 2015-08-07 ENCOUNTER — Telehealth: Payer: Self-pay

## 2015-08-07 ENCOUNTER — Ambulatory Visit (INDEPENDENT_AMBULATORY_CARE_PROVIDER_SITE_OTHER): Payer: BLUE CROSS/BLUE SHIELD

## 2015-08-07 ENCOUNTER — Other Ambulatory Visit: Payer: Self-pay | Admitting: Gynecology

## 2015-08-07 ENCOUNTER — Ambulatory Visit (INDEPENDENT_AMBULATORY_CARE_PROVIDER_SITE_OTHER): Payer: BLUE CROSS/BLUE SHIELD | Admitting: Gynecology

## 2015-08-07 ENCOUNTER — Encounter: Payer: Self-pay | Admitting: Gynecology

## 2015-08-07 VITALS — BP 134/80

## 2015-08-07 DIAGNOSIS — N8331 Acquired atrophy of ovary: Secondary | ICD-10-CM | POA: Diagnosis not present

## 2015-08-07 DIAGNOSIS — R9389 Abnormal findings on diagnostic imaging of other specified body structures: Secondary | ICD-10-CM

## 2015-08-07 DIAGNOSIS — N840 Polyp of corpus uteri: Secondary | ICD-10-CM

## 2015-08-07 DIAGNOSIS — N83319 Acquired atrophy of ovary, unspecified side: Secondary | ICD-10-CM

## 2015-08-07 DIAGNOSIS — R938 Abnormal findings on diagnostic imaging of other specified body structures: Secondary | ICD-10-CM

## 2015-08-07 DIAGNOSIS — N9489 Other specified conditions associated with female genital organs and menstrual cycle: Secondary | ICD-10-CM

## 2015-08-07 DIAGNOSIS — N912 Amenorrhea, unspecified: Secondary | ICD-10-CM

## 2015-08-07 NOTE — Progress Notes (Signed)
Cheryl Mckee 1956-10-23 [id number]        59 y.o.  G0P0 Presents for sonohysterogram.  History of amenorrhea after progesterone withdrawal one year ago. FSH normal. Sonohysterogram requested to rule out endometrial abnormality such as hyperplasia.  Past medical history,surgical history, problem list, medications, allergies, family history and social history were all reviewed and documented in the EPIC chart.  Directed ROS with pertinent positives and negatives documented in the history of present illness/assessment and plan.  Exam: [place] assistant Filed Vitals:   08/07/15 0900  BP: 134/80   General appearance:  Normal External BUS vagina normal with urethral diverticulum noted. Cervix grossly normal.  Ultrasound shows uterus normal size. Endometrial echo 7.8 mm. Right and left ovaries not clearly identified but no pathology noted in the right or left adnexa. Cul-de-sac negative.  Sonohysterogram performed, sterile technique, easy catheter introduction, good distention with 2 endometrial defects noted both 13 mm consistent with endometrial polyps. Endometrial sample taken. Patient tolerated well.  Assessment/Plan:  59 y.o. G0P0 with prolonged amenorrhea, normal FSH and endometrial assessment showing polyps. Reviewed situation with the patient and recommended hysteroscopy D&C with resection of the endometrial polyps rule out hyperplasia/significant atypia.  I reviewed the proposed surgery with the patient to include the expected intraoperative and postoperative courses as well as the recovery period. The use of the hysteroscope, resectoscope and the D&C portion were all discussed. The risks of surgery to include infection, prolonged antibiotics, hemorrhage necessitating transfusion and the risks of transfusion, including transfusion reaction, hepatitis, HIV, mad cow disease and other unknown entities were all discussed understood and accepted. The risk of damage to internal organs during the  procedure, either immediately recognized or delay recognized, including vagina, cervix, uterus, possible perforation causing damage to bowel, bladder, ureters, vessels and nerves necessitating major exploratory reparative surgery and future reparative surgeries including bladder repair, ureteral damage repair, bowel resection, ostomy formation was also discussed understood and accepted. The potential for distended media absorption leading to metabolic complications such as fluid overload, coma and seizures was also discussed understood and accepted. The patient's questions were answered to her satisfaction and she is ready to proceed with surgery.     Dara Lords MD, 9:27 AM 08/07/2015

## 2015-08-07 NOTE — Telephone Encounter (Signed)
Left message for patient to call me about scheduling. 

## 2015-08-07 NOTE — Patient Instructions (Signed)
Office will call you with biopsy results and to schedule surgery

## 2015-08-08 MED ORDER — MISOPROSTOL 200 MCG PO TABS: ORAL_TABLET | ORAL | Status: DC

## 2015-08-08 NOTE — Telephone Encounter (Signed)
Patient called me back. At her request, because of her job, I scheduled her on a Friday 09/27/15 at 7:30am at [facility].  She will see Dr. Velvet Bathe prior for pre op consult.  I discussed with her need to contact her PCP and get preoperative clearance. I encouraged her to call as soon as possible in case they need to see her and appointments are running a ways out.  I also advised her regarding Cytotec tab vaginally HS before surgery and Rx was sent.

## 2015-08-09 ENCOUNTER — Encounter: Payer: Self-pay | Admitting: Gynecology

## 2015-09-20 ENCOUNTER — Other Ambulatory Visit: Payer: Self-pay

## 2015-09-20 ENCOUNTER — Ambulatory Visit (INDEPENDENT_AMBULATORY_CARE_PROVIDER_SITE_OTHER): Payer: BLUE CROSS/BLUE SHIELD | Admitting: Gynecology

## 2015-09-20 ENCOUNTER — Encounter (HOSPITAL_COMMUNITY)
Admission: RE | Admit: 2015-09-20 | Discharge: 2015-09-20 | Disposition: A | Discharge status: Home / Self Care | Payer: BLUE CROSS/BLUE SHIELD | Source: Ambulatory Visit | Attending: Gynecology | Admitting: Gynecology

## 2015-09-20 ENCOUNTER — Encounter: Payer: Self-pay | Admitting: Gynecology

## 2015-09-20 VITALS — BP 144/86

## 2015-09-20 DIAGNOSIS — N840 Polyp of corpus uteri: Secondary | ICD-10-CM | POA: Insufficient documentation

## 2015-09-20 DIAGNOSIS — Z01818 Encounter for other preprocedural examination: Secondary | ICD-10-CM | POA: Insufficient documentation

## 2015-09-20 LAB — COMPREHENSIVE METABOLIC PANEL
ALBUMIN: 3.4 g/dL — AB (ref 3.5–5.0)
ALK PHOS: 138 U/L — AB (ref 38–126)
ALT: 40 U/L (ref 14–54)
AST: 37 U/L (ref 15–41)
Anion gap: 6 (ref 5–15)
BILIRUBIN TOTAL: 0.4 mg/dL (ref 0.3–1.2)
BUN: 11 mg/dL (ref 6–20)
CO2: 28 mmol/L (ref 22–32)
Calcium: 9 mg/dL (ref 8.9–10.3)
Chloride: 104 mmol/L (ref 101–111)
Creatinine, Ser: 0.71 mg/dL (ref 0.44–1.00)
GFR calc Af Amer: >60 mL/min (ref >60)
Glucose, Bld: 121 mg/dL — ABNORMAL HIGH (ref 65–99)
POTASSIUM: 3.8 mmol/L (ref 3.5–5.1)
Sodium: 138 mmol/L (ref 135–145)
TOTAL PROTEIN: 7.9 g/dL (ref 6.5–8.1)

## 2015-09-20 LAB — CBC
HEMATOCRIT: 40.3 % (ref 36.0–46.0)
HEMOGLOBIN: 12.9 g/dL (ref 12.0–15.0)
MCH: 26.3 pg (ref 26.0–34.0)
MCHC: 32 g/dL (ref 30.0–36.0)
MCV: 82.1 fL (ref 78.0–100.0)
Platelets: 256 10*3/uL (ref 150–400)
RBC: 4.91 MIL/uL (ref 3.87–5.11)
RDW: 14.7 % (ref 11.5–15.5)
WBC: 10.7 10*3/uL — ABNORMAL HIGH (ref 4.0–10.5)

## 2015-09-20 MED ORDER — OXYCODONE-ACETAMINOPHEN 5-325 MG PO TABS: 15.0000 | ORAL_TABLET | ORAL | Status: DC | PRN

## 2015-09-20 NOTE — H&P (Addendum)
  Debbe BalesSharon A Hardage 06/05/1956 [id number]   History and Physical  Chief complaint: endometrial polyps  History of present illness: 59 y.o. G0P0 with history of amenorrhea over the past year and normal FSH. Sonohysterogram showed endometrial echo 7.8 mm with 2 endometrial defects consistent with endometrial polyps both measuring 13 mm.  Endometrial sample showed benign endometrium (limited material). Patient admitted for hysteroscopy D&C with resection of the endometrial polyps.  Past medical history,surgical history, medications, allergies, family history and social history were all reviewed and documented in the EPIC chart.  ROS:  Was performed and pertinent positives and negatives are included in the history of present illness.  Exam:  [place] assistant 09/20/2015 General: well developed, well nourished female, no acute distress HEENT: normal  Lungs: clear to auscultation without wheezing, rales or rhonchi  Cardiac: regular rate without rubs, murmurs or gallops  Abdomen: soft, obese,nontender without masses, guarding, rebound, organomegaly  Pelvic: external bus vagina: normal   Cervix: grossly normal  Uterus: unable to palpate but no gross masses or tenderness Adnexa: without gross masses or tenderness     Assessment/Plan:  59 y.o. G0P0 with history of irregular menses to be oligoovulatory pattern treated with intermittent progesterone withdrawals. Her last withdrawal was one year ago with no menses since. Ultrasound/sonohysterogram shows endometrial polyps. The patient is admitted for hysteroscopy D&C with resection of endometrial polyps.  I reviewed the proposed surgery with the patient to include the expected intraoperative and postoperative courses as well as the recovery period. The use of the hysteroscope, resectoscope and the D&C portion were all discussed. The risks of surgery to include infection, prolonged antibiotics, hemorrhage necessitating transfusion and the risks of transfusion,  including transfusion reaction, hepatitis, HIV, mad cow disease and other unknown entities were all discussed understood and accepted. The risk of damage to internal organs during the procedure, either immediately recognized or delay recognized, including vagina, cervix, uterus, possible perforation causing damage to bowel, bladder, ureters, vessels and nerves necessitating major exploratory reparative surgery and future reparative surgeries including bladder repair, ureteral damage repair, bowel resection, ostomy formation was also discussed understood and accepted. The potential for distended media absorption leading to metabolic complications such as fluid overload, coma and seizures was also discussed understood and accepted. I reviewed with her that we will discuss the long-term plan after final pathology returns as far as whether to continue with intermittent progesterone withdrawals and we will discuss this at the postoperative visit. The patient's questions were answered to her satisfaction and she is ready to proceed with surgery.    Dara LordsFONTAINE,Alphonza Tramell P MD, 11:43 AM 09/20/2015

## 2015-09-20 NOTE — Patient Instructions (Signed)
Followup for surgery as scheduled. 

## 2015-09-20 NOTE — Patient Instructions (Addendum)
   Your procedure is scheduled on: OCT 21 AT 730AM  [facility] through [facility] at: 6[facility]  Pick up the phone at the desk and dial [phone] and inform us of your arrival.  Please call this number if you have any problems the morning of surgery: [phone]  DO NOT EAT OR DRINK AFTER MIDNIGHT OCT 20 (THURSDAY)  Take these medicines the morning of surgery with a SIP OF WATER: TAKE 40UNITS OF LANTUS NIGHT BEFORE SURGERY.. DO NOT TAKE METFORMIN OR INSULIN DAY OF SURGERY... TAKE  BLOOD PRESSURE MEDS DAY OF SURGERY Do not wear jewelry, make-up, or FINGER nail polish No metal in your hair or on your body. Do not wear lotions, powders, perfumes.  You may wear deodorant.  Do not bring valuables to the hospital. Contacts, dentures or bridgework may not be worn into surgery..    Patients discharged on the day of surgery will not be allowed to drive home.

## 2015-09-20 NOTE — Progress Notes (Signed)
Cheryl BalesSharon A Mckee 10/19/1956 [id number]   Preoperative consult  Chief complaint: endometrial polyps  History of present illness: 59 y.o. G0P0 with history of amenorrhea over the past year and normal FSH. Sonohysterogram showed endometrial echo 7.8 mm with 2 endometrial defects consistent with endometrial polyps both measuring 13 mm.  Endometrial sample showed benign endometrium (limited material). Patient submitted for hysteroscopy D&C with resection of the endometrial polyps.  Past medical history,surgical history, medications, allergies, family history and social history were all reviewed and documented in the EPIC chart.  ROS:  Was performed and pertinent positives and negatives are included in the history of present illness.  Exam:  Kim assistant General: well developed, well nourished female, no acute distress HEENT: normal  Lungs: clear to auscultation without wheezing, rales or rhonchi  Cardiac: regular rate without rubs, murmurs or gallops  Abdomen: soft, obese,nontender without masses, guarding, rebound, organomegaly  Pelvic: external bus vagina: normal   Cervix: grossly normal  Uterus: unable to palpate but no gross masses or tenderness Adnexa: without gross masses or tenderness     Assessment/Plan:  59 y.o. G0P0 with history of irregular menses to be oligoovulatory pattern treated with intermittent progesterone withdrawals. Her last withdrawal was one year ago with no menses since. Ultrasound/sonohysterogram shows endometrial polyps. The patient is admitted for hysteroscopy D&C with resection of endometrial polyps.  I reviewed the proposed surgery with the patient to include the expected intraoperative and postoperative courses as well as the recovery period. The use of the hysteroscope, resectoscope and the D&C portion were all discussed. The risks of surgery to include infection, prolonged antibiotics, hemorrhage necessitating transfusion and the risks of transfusion, including  transfusion reaction, hepatitis, HIV, mad cow disease and other unknown entities were all discussed understood and accepted. The risk of damage to internal organs during the procedure, either immediately recognized or delay recognized, including vagina, cervix, uterus, possible perforation causing damage to bowel, bladder, ureters, vessels and nerves necessitating major exploratory reparative surgery and future reparative surgeries including bladder repair, ureteral damage repair, bowel resection, ostomy formation was also discussed understood and accepted. The potential for distended media absorption leading to metabolic complications such as fluid overload, coma and seizures was also discussed understood and accepted. I reviewed with her that we will discuss the long-term plan after final pathology returns as far as whether to continue with intermittent progesterone withdrawals and we will discuss this at the postoperative visit. The patient's questions were answered to her satisfaction and she is ready to proceed with surgery.    Dara LordsFONTAINE,Katie Faraone P MD, 11:37 AM 09/20/2015

## 2015-09-26 ENCOUNTER — Encounter (HOSPITAL_COMMUNITY): Payer: Self-pay | Admitting: Anesthesiology

## 2015-09-26 MED ORDER — CEFOTETAN DISODIUM 2 G IJ SOLR
2.0000 g | INTRAMUSCULAR | Status: AC
  Administered 2015-09-27: 2 g via INTRAVENOUS
  Filled 2015-09-26: qty 2

## 2015-09-26 NOTE — Anesthesia Preprocedure Evaluation (Addendum)
Anesthesia Evaluation  Patient identified by MRN, date of birth, ID band Patient awake    Reviewed: Allergy & Precautions, NPO status , Patient's Chart, lab work & pertinent test results  Airway Mallampati: IV  TM Distance: >3 FB Neck ROM: Full    Dental  (+) Chipped,    Pulmonary sleep apnea ,    Pulmonary exam normal breath sounds clear to auscultation       Cardiovascular hypertension, Normal cardiovascular exam Rhythm:Regular Rate:Normal     Neuro/Psych PSYCHIATRIC DISORDERS Depression  Neuromuscular disease    GI/Hepatic negative GI ROS, Biliary cirrhosis   Endo/Other  diabetes, Type 2, Insulin Dependent, Oral Hypoglycemic AgentsMorbid obesityHyperlipidemia  Renal/GU negative Renal ROS  negative genitourinary   Musculoskeletal  (+) Arthritis , Osteoarthritis,  Fibromyalgia -  Abdominal (+) + obese,   Peds  Hematology negative hematology ROS (+)   Anesthesia Other Findings   Reproductive/Obstetrics Endometrial polyp                           Anesthesia Physical Anesthesia Plan  ASA: III  Anesthesia Plan: General   Post-op Pain Management:    Induction: Intravenous  Airway Management Planned: LMA  Additional Equipment:   Intra-op Plan:   Post-operative Plan: Extubation in OR  Informed Consent: I have reviewed the patients History and Physical, chart, labs and discussed the procedure including the risks, benefits and alternatives for the proposed anesthesia with the patient or authorized representative who has indicated his/her understanding and acceptance.   Dental advisory given  Plan Discussed with: CRNA, Anesthesiologist and Surgeon  Anesthesia Plan Comments:         Anesthesia Quick Evaluation

## 2015-09-27 ENCOUNTER — Ambulatory Visit (HOSPITAL_COMMUNITY)
Admission: RE | Admit: 2015-09-27 | Discharge: 2015-09-27 | Disposition: A | Discharge status: Home / Self Care | Payer: BLUE CROSS/BLUE SHIELD | Source: Ambulatory Visit | Attending: Gynecology | Admitting: Gynecology

## 2015-09-27 ENCOUNTER — Encounter (HOSPITAL_COMMUNITY): Payer: Self-pay

## 2015-09-27 ENCOUNTER — Ambulatory Visit (HOSPITAL_COMMUNITY): Payer: BLUE CROSS/BLUE SHIELD | Admitting: Anesthesiology

## 2015-09-27 ENCOUNTER — Encounter (HOSPITAL_COMMUNITY)
Admission: RE | Disposition: A | Discharge status: Home / Self Care | Payer: Self-pay | Source: Ambulatory Visit | Attending: Gynecology

## 2015-09-27 ENCOUNTER — Telehealth: Payer: Self-pay | Admitting: *Deleted

## 2015-09-27 DIAGNOSIS — N840 Polyp of corpus uteri: Secondary | ICD-10-CM | POA: Insufficient documentation

## 2015-09-27 DIAGNOSIS — Z794 Long term (current) use of insulin: Secondary | ICD-10-CM | POA: Diagnosis not present

## 2015-09-27 DIAGNOSIS — E119 Type 2 diabetes mellitus without complications: Secondary | ICD-10-CM | POA: Diagnosis not present

## 2015-09-27 DIAGNOSIS — I10 Essential (primary) hypertension: Secondary | ICD-10-CM | POA: Diagnosis not present

## 2015-09-27 DIAGNOSIS — N912 Amenorrhea, unspecified: Secondary | ICD-10-CM | POA: Diagnosis not present

## 2015-09-27 HISTORY — PX: DILATATION & CURETTAGE/HYSTEROSCOPY WITH MYOSURE: SHX6511

## 2015-09-27 LAB — GLUCOSE, CAPILLARY
Glucose-Capillary: 101 mg/dL — ABNORMAL HIGH (ref 65–99)
Glucose-Capillary: 139 mg/dL — ABNORMAL HIGH (ref 65–99)

## 2015-09-27 LAB — HCG, SERUM, QUALITATIVE: Preg, Serum: NEGATIVE

## 2015-09-27 SURGERY — DILATATION & CURETTAGE/HYSTEROSCOPY WITH MYOSURE
Anesthesia: General | Site: Vagina

## 2015-09-27 MED ORDER — OXYCODONE-ACETAMINOPHEN 5-325 MG PO TABS
1.0000 | ORAL_TABLET | ORAL | Status: DC | PRN
  Administered 2015-09-27: 1 via ORAL

## 2015-09-27 MED ORDER — MIDAZOLAM HCL 2 MG/2ML IJ SOLN
INTRAMUSCULAR | Status: AC
  Filled 2015-09-27: qty 4

## 2015-09-27 MED ORDER — LABETALOL HCL 5 MG/ML IV SOLN
INTRAVENOUS | Status: DC | PRN
  Administered 2015-09-27: 5 mg via INTRAVENOUS

## 2015-09-27 MED ORDER — DEXAMETHASONE SODIUM PHOSPHATE 4 MG/ML IJ SOLN
INTRAMUSCULAR | Status: AC
  Filled 2015-09-27: qty 1

## 2015-09-27 MED ORDER — FENTANYL CITRATE (PF) 100 MCG/2ML IJ SOLN
INTRAMUSCULAR | Status: AC
Start: 2015-09-27 — End: 2015-09-27
  Filled 2015-09-27: qty 4

## 2015-09-27 MED ORDER — LIDOCAINE HCL 1 % IJ SOLN
INTRAMUSCULAR | Status: AC
  Filled 2015-09-27: qty 20

## 2015-09-27 MED ORDER — LIDOCAINE HCL (CARDIAC) 20 MG/ML IV SOLN
INTRAVENOUS | Status: AC
  Filled 2015-09-27: qty 5

## 2015-09-27 MED ORDER — METOCLOPRAMIDE HCL 5 MG/ML IJ SOLN: 10.0000 mg | Freq: Once | INTRAMUSCULAR | Status: DC | PRN

## 2015-09-27 MED ORDER — PROPOFOL 10 MG/ML IV BOLUS
INTRAVENOUS | Status: AC
  Filled 2015-09-27: qty 20

## 2015-09-27 MED ORDER — LIDOCAINE-EPINEPHRINE 1 %-1:100000 IJ SOLN
INTRAMUSCULAR | Status: AC
  Filled 2015-09-27: qty 1

## 2015-09-27 MED ORDER — LABETALOL HCL 5 MG/ML IV SOLN
INTRAVENOUS | Status: AC
  Filled 2015-09-27: qty 4

## 2015-09-27 MED ORDER — ONDANSETRON HCL 4 MG/2ML IJ SOLN
INTRAMUSCULAR | Status: AC
  Filled 2015-09-27: qty 2

## 2015-09-27 MED ORDER — LACTATED RINGERS IV SOLN
INTRAVENOUS | Status: DC
  Administered 2015-09-27: 10 mL/h via INTRAVENOUS

## 2015-09-27 MED ORDER — OXYCODONE-ACETAMINOPHEN 5-325 MG PO TABS: 1.0000 | ORAL_TABLET | ORAL | Status: DC | PRN

## 2015-09-27 MED ORDER — KETOROLAC TROMETHAMINE 30 MG/ML IJ SOLN
INTRAMUSCULAR | Status: DC | PRN
  Administered 2015-09-27: 30 mg via INTRAVENOUS

## 2015-09-27 MED ORDER — LIDOCAINE HCL 1 % IJ SOLN
INTRAMUSCULAR | Status: DC | PRN
  Administered 2015-09-27: 10 mL

## 2015-09-27 MED ORDER — SCOPOLAMINE 1 MG/3DAYS TD PT72
1.0000 | MEDICATED_PATCH | Freq: Once | TRANSDERMAL | Status: DC
  Administered 2015-09-27: 1.5 mg via TRANSDERMAL

## 2015-09-27 MED ORDER — SODIUM CHLORIDE 0.9 % IR SOLN
Status: DC | PRN
  Administered 2015-09-27: 3000 mL

## 2015-09-27 MED ORDER — MIDAZOLAM HCL 2 MG/2ML IJ SOLN
INTRAMUSCULAR | Status: DC | PRN
  Administered 2015-09-27: 2 mg via INTRAVENOUS

## 2015-09-27 MED ORDER — FENTANYL CITRATE (PF) 100 MCG/2ML IJ SOLN
INTRAMUSCULAR | Status: DC | PRN
  Administered 2015-09-27: 50 ug via INTRAVENOUS

## 2015-09-27 MED ORDER — OXYCODONE-ACETAMINOPHEN 5-325 MG PO TABS
ORAL_TABLET | ORAL | Status: AC
  Filled 2015-09-27: qty 1

## 2015-09-27 MED ORDER — ONDANSETRON HCL 4 MG/2ML IJ SOLN
INTRAMUSCULAR | Status: DC | PRN
  Administered 2015-09-27: 4 mg via INTRAVENOUS

## 2015-09-27 MED ORDER — DEXAMETHASONE SODIUM PHOSPHATE 10 MG/ML IJ SOLN
INTRAMUSCULAR | Status: DC | PRN
  Administered 2015-09-27: 4 mg via INTRAVENOUS

## 2015-09-27 MED ORDER — SCOPOLAMINE 1 MG/3DAYS TD PT72
MEDICATED_PATCH | TRANSDERMAL | Status: AC
  Filled 2015-09-27: qty 1

## 2015-09-27 MED ORDER — KETOROLAC TROMETHAMINE 30 MG/ML IJ SOLN
INTRAMUSCULAR | Status: AC
  Filled 2015-09-27: qty 1

## 2015-09-27 MED ORDER — PROPOFOL 10 MG/ML IV BOLUS
INTRAVENOUS | Status: DC | PRN
  Administered 2015-09-27: 200 mg via INTRAVENOUS

## 2015-09-27 MED ORDER — LIDOCAINE HCL (CARDIAC) 20 MG/ML IV SOLN
INTRAVENOUS | Status: DC | PRN
  Administered 2015-09-27: 70 mg via INTRAVENOUS
  Administered 2015-09-27: 30 mg via INTRAVENOUS

## 2015-09-27 SURGICAL SUPPLY — 15 items
CATH ROBINSON RED A/P 16FR (CATHETERS) ×3 IMPLANT
CLOTH BEACON ORANGE TIMEOUT ST (SAFETY) ×3 IMPLANT
CONTAINER PREFILL 10% NBF 60ML (FORM) ×6 IMPLANT
DEVICE MYOSURE LITE (MISCELLANEOUS) ×2 IMPLANT
DEVICE MYOSURE REACH (MISCELLANEOUS) IMPLANT
FILTER ARTHROSCOPY CONVERTOR (FILTER) ×3 IMPLANT
GLOVE BIO SURGEON STRL SZ7.5 (GLOVE) ×6 IMPLANT
GOWN STRL REUS W/TWL LRG LVL3 (GOWN DISPOSABLE) ×6 IMPLANT
PACK VAGINAL MINOR WOMEN LF (CUSTOM PROCEDURE TRAY) ×3 IMPLANT
PAD OB MATERNITY 4.3X12.25 (PERSONAL CARE ITEMS) ×3 IMPLANT
SEAL ROD LENS SCOPE MYOSURE (ABLATOR) ×3 IMPLANT
TOWEL OR 17X24 6PK STRL BLUE (TOWEL DISPOSABLE) ×6 IMPLANT
TUBING AQUILEX INFLOW (TUBING) ×3 IMPLANT
TUBING AQUILEX OUTFLOW (TUBING) ×3 IMPLANT
WATER STERILE IRR 1000ML POUR (IV SOLUTION) ×3 IMPLANT

## 2015-09-27 NOTE — Telephone Encounter (Signed)
Rx printed patient will come pick

## 2015-09-27 NOTE — Transfer of Care (Signed)
Immediate Anesthesia Transfer of Care Note  Patient: Cheryl Mckee  Procedure(s) Performed: Procedure(s): DILATATION & CURETTAGE/HYSTEROSCOPY WITH MYOSURE (N/A)  Patient Location: PACU  Anesthesia Type:General  Level of Consciousness: awake, alert , oriented and patient cooperative  Airway & Oxygen Therapy: Patient Spontanous Breathing and Patient connected to nasal cannula oxygen  Post-op Assessment: Report given to RN and Post -op Vital signs reviewed and stable  Post vital signs: Reviewed and stable  Last Vitals:  Filed Vitals:   09/27/15 0636  BP: 196/85  Pulse:   Temp:   Resp:     Complications: No apparent anesthesia complications

## 2015-09-27 NOTE — Anesthesia Postprocedure Evaluation (Signed)
  Anesthesia Post-op Note  Patient: Cheryl Mckee  Procedure(s) Performed: Procedure(s): DILATATION & CURETTAGE/HYSTEROSCOPY WITH MYOSURE (N/A)  Patient Location: PACU  Anesthesia Type:General  Level of Consciousness: awake, alert  and oriented  Airway and Oxygen Therapy: Patient Spontanous Breathing  Post-op Pain: none  Post-op Assessment: Post-op Vital signs reviewed, Patient's Cardiovascular Status Stable, Respiratory Function Stable, Patent Airway, No signs of Nausea or vomiting and Pain level controlled              Post-op Vital Signs: Reviewed and stable  Last Vitals:  Filed Vitals:   09/27/15 0930  BP: 174/82  Pulse: 68  Temp:   Resp: 17    Complications: No apparent anesthesia complications

## 2015-09-27 NOTE — Telephone Encounter (Signed)
New prescription for Percocet 5/325 #15 one by mouth every 4 hours when necessary pain no refill

## 2015-09-27 NOTE — Anesthesia Procedure Notes (Signed)
Procedure Name: LMA Insertion Date/Time: 09/27/2015 7:25 AM Performed by: Suella GroveMOORE, Yoshiaki Kreuser C Pre-anesthesia Checklist: Emergency Drugs available, Patient identified, Suction available and Patient being monitored Patient Re-evaluated:Patient Re-evaluated prior to inductionOxygen Delivery Method: Circle system utilized and Simple face mask Preoxygenation: Pre-oxygenation with 100% oxygen Intubation Type: IV induction Ventilation: Mask ventilation without difficulty LMA: LMA inserted LMA Size: 4.0 Grade View: Grade III Tube type: Oral Number of attempts: 1 Placement Confirmation: positive ETCO2,  CO2 detector and breath sounds checked- equal and bilateral Dental Injury: Teeth and Oropharynx as per pre-operative assessment  Difficulty Due To: Difficulty was anticipated

## 2015-09-27 NOTE — Discharge Instructions (Signed)
° °  Postoperative Instructions Hysteroscopy D & C ° °Dr. Fontaine and the nursing staff have discussed postoperative instructions with you.  If you have any questions please ask them before you leave the hospital, or call Dr Fontaine’s office at [phone].   ° °We would like to emphasize the following instructions: ° ° °? Call the office to make your follow-up appointment as recommended by Dr Fontaine (usually 1-2 weeks). ° °? You were given a prescription, or one was ordered for you at the pharmacy you designated.  Get that prescription filled and take the medication according to instructions. ° °? You may eat a regular diet, but slowly until you start having bowel movements. ° °? Drink plenty of water daily. ° °? Nothing in the vagina (intercourse, douching, objects of any kind) for two weeks.  When reinitiating intercourse, if it is uncomfortable, stop and make an appointment with Dr Fontaine to be evaluated. ° °? No driving for one to two days until the effects of anesthesia has worn off.  No traveling out of town for several days. ° °? You may shower, but no baths for one week.  Walking up and down stairs is ok.  No heavy lifting, prolonged standing, repeated bending or any “working out” until your post op check. ° °? Rest frequently, listen to your body and do not push yourself and overdo it. ° °? Call if: ° °o Your pain medication does not seem strong enough. °o Worsening pain or abdominal bloating °o Persistent nausea or vomiting °o Difficulty with urination or bowel movements. °o Temperature of 101 degrees or higher. °o Heavy vaginal bleeding.  If your period is due, you may use tampons. °o You have any questions or concerns ° ° ° °Post Anesthesia Home Care Instructions ° °Activity: °Get plenty of rest for the remainder of the day. A responsible adult should stay with you for 24 hours following the procedure.  °For the next 24 hours, DO NOT: °-Drive a car °-Operate machinery °-Drink alcoholic  beverages °-Take any medication unless instructed by your physician °-Make any legal decisions or sign important papers. ° °Meals: °Start with liquid foods such as gelatin or soup. Progress to regular foods as tolerated. Avoid greasy, spicy, heavy foods. If nausea and/or vomiting occur, drink only clear liquids until the nausea and/or vomiting subsides. Call your physician if vomiting continues. ° °Special Instructions/Symptoms: °Your throat may feel dry or sore from the anesthesia or the breathing tube placed in your throat during surgery. If this causes discomfort, gargle with warm salt water. The discomfort should disappear within 24 hours. ° °If you had a scopolamine patch placed behind your ear for the management of post- operative nausea and/or vomiting: ° °1. The medication in the patch is effective for 72 hours, after which it should be removed.  Wrap patch in a tissue and discard in the trash. Wash hands thoroughly with soap and water. °2. You may remove the patch earlier than 72 hours if you experience unpleasant side effects which may include dry mouth, dizziness or visual disturbances. °3. Avoid touching the patch. Wash your hands with soap and water after contact with the patch. °  ° °

## 2015-09-27 NOTE — H&P (Signed)
  The patient was examined.  I reviewed the proposed surgery and consent form with the patient.  The dictated history and physical is current and accurate and all questions were answered. The patient is ready to proceed with surgery and has a realistic understanding and expectation for the outcome.   Dara LordsFONTAINE,Lynnel Zanetti P MD, 7:04 AM 09/27/2015

## 2015-09-27 NOTE — Op Note (Signed)
Cheryl BalesSharon A Cauble 01/27/1956 [id number]   Post Operative Note   Date of surgery:  09/27/2015  Pre Op Dx:  Endometrial polyps  Post Op Dx:  Endometrial polyps  Procedure:  Hysteroscopy, D&C, Myosure resection of endometrial polyps  Surgeon:  Dara LordsFONTAINE,TIMOTHY P  Anesthesia:  General  EBL:  minimal  Distended media discrepancy:  180 cc saline  Complications:  None  Specimen:  #1 endometrial polyps #2 endometrial curetting to pathology  Findings: EUA:  External BUS vagina with atrophic changes. Cervix with atrophic changes. Bimanual exam without gross masses. Limited by abdominal girth   Hysteroscopy:  Adequate noting fundus, anterior/posterior and ventral surfaces, lower uterine segment, endocervical canal, right/left tubal ostia all visualized. Four classic appearing endometrial polyps noted, resected to the level the surrounding endometrium  Procedure:  The patient was taken to the operating room, was placed in the low dorsal lithotomy position, underwent general anesthesia, received a perineal/vaginal preparation with Betadine solution per nursing personnel and the bladder was emptied with in and out Foley catheterization. The timeout was performed by the surgical team. An EUA was performed. The patient was draped in the usual fashion. The cervix was visualized with a speculum, anterior lip grasped with a single-tooth tenaculum and a paracervical block was placed using 10 cc's of 1% lidocaine. The cervix was gently dilated to admit the Myosure hysteroscope and hysteroscopy was performed with findings noted above. Using the Myosure Lite resectoscopic wand the polyps were resected in their entirety to the level the surrounding endometrium. A gentle sharp curettage was performed. Both specimens were sent to pathology separately.  Repeat hysteroscopy showed an empty cavity with good distention and no evidence of perforation. The instruments were removed and adequate hemostasis was visualized at  the tenaculum site and external cervical os. The patient was given intraoperative Toradol, was awakened without difficulty and was taken to the recovery room in good condition having tolerated the procedure well.      Dara LordsFONTAINE,TIMOTHY P MD, 8:01 AM 09/27/2015

## 2015-09-27 NOTE — Telephone Encounter (Signed)
Dr.Fontaine the pharmacist called because directions for the percocet 5/325 directions state take 15 tablets by mouth every 4 hours Rx for surgery patient today. They need a new Rx and patient will come pick up. Please advise

## 2015-09-29 ENCOUNTER — Encounter (HOSPITAL_COMMUNITY): Payer: Self-pay | Admitting: Gynecology

## 2015-10-10 ENCOUNTER — Ambulatory Visit (INDEPENDENT_AMBULATORY_CARE_PROVIDER_SITE_OTHER): Payer: BLUE CROSS/BLUE SHIELD | Admitting: Gynecology

## 2015-10-10 ENCOUNTER — Encounter: Payer: Self-pay | Admitting: Gynecology

## 2015-10-10 VITALS — BP 130/84

## 2015-10-10 DIAGNOSIS — Z9889 Other specified postprocedural states: Secondary | ICD-10-CM

## 2015-10-10 MED ORDER — MEDROXYPROGESTERONE ACETATE 10 MG PO TABS: 10.0000 mg | ORAL_TABLET | Freq: Every day | ORAL | Status: DC

## 2015-10-10 NOTE — Patient Instructions (Signed)
Take the progesterone pills daily for 14 days every other month if you do not have a period to bring on bleeding or shed the lining of the uterus.  You may not bleeding at the end of these pills and that is okay. Do it every other month until I see you in August 2017 for your annual exam

## 2015-10-10 NOTE — Progress Notes (Signed)
Cheryl BalesSharon A Mckee 12/30/1955 [id number]        59 y.o.  G0P0 presents for postoperative visit status post hysteroscopic resection of endometrial polyps D&C. She's done well with no further bleeding.  Past medical history,surgical history, problem list, medications, allergies, family history and social history were all reviewed and documented in the EPIC chart.  Directed ROS with pertinent positives and negatives documented in the history of present illness/assessment and plan.  Exam: Kim assistant Filed Vitals:   10/10/15 1227  BP: 130/84   General appearance:  Normal Abdomen soft nontender without gross masses Pelvic external BUS vagina normal. Cervix normal. Uterus unable to palpate but no gross masses or tenderness. Adnexa without gross masses or tenderness  Assessment/Plan:  59 y.o. G0P0 with normal postoperative visit status post hysteroscopy D&C. Reviewed benign pathology showing benign endometrial polyp and curettings. Reviewed pictures from the surgery. Patient will withdraw on Provera 10 mg 14 days every other month if without menses this coming year noting her FSH was normal. She knows to do this every other month regardless whether she bleeds or not at the end of the pills. Follow up in August 2017 when due for annual exam. Sooner if prolonged or atypical bleeding.    Dara LordsFONTAINE,Cheryl Whitmyer P MD, 12:43 PM 10/10/2015

## 2016-01-23 ENCOUNTER — Ambulatory Visit (INDEPENDENT_AMBULATORY_CARE_PROVIDER_SITE_OTHER): Payer: BLUE CROSS/BLUE SHIELD | Admitting: Family Medicine

## 2016-01-23 VITALS — BP 180/100 | Temp 98.0°F

## 2016-01-23 DIAGNOSIS — Z9119 Patient's noncompliance with other medical treatment and regimen: Secondary | ICD-10-CM | POA: Diagnosis not present

## 2016-01-23 DIAGNOSIS — Z91199 Patient's noncompliance with other medical treatment and regimen due to unspecified reason: Secondary | ICD-10-CM

## 2016-01-23 DIAGNOSIS — R5383 Other fatigue: Secondary | ICD-10-CM

## 2016-01-23 DIAGNOSIS — E119 Type 2 diabetes mellitus without complications: Secondary | ICD-10-CM | POA: Diagnosis not present

## 2016-01-23 DIAGNOSIS — R0789 Other chest pain: Secondary | ICD-10-CM | POA: Diagnosis not present

## 2016-01-23 DIAGNOSIS — N390 Urinary tract infection, site not specified: Secondary | ICD-10-CM | POA: Diagnosis not present

## 2016-01-23 DIAGNOSIS — R350 Frequency of micturition: Secondary | ICD-10-CM

## 2016-01-23 DIAGNOSIS — R110 Nausea: Secondary | ICD-10-CM

## 2016-01-23 DIAGNOSIS — J22 Unspecified acute lower respiratory infection: Secondary | ICD-10-CM

## 2016-01-23 DIAGNOSIS — J988 Other specified respiratory disorders: Secondary | ICD-10-CM | POA: Diagnosis not present

## 2016-01-23 LAB — POC MICROSCOPIC URINALYSIS (UMFC): Mucus: ABSENT

## 2016-01-23 LAB — POCT CBC
Granulocyte percent: 60.6 %G (ref 37–80)
HCT, POC: 42 % (ref 37.7–47.9)
Hemoglobin: 13.7 g/dL (ref 12.2–16.2)
Lymph, poc: 3.6 — AB (ref 0.6–3.4)
MCH, POC: 25.9 pg — AB (ref 27–31.2)
MCHC: 32.7 g/dL (ref 31.8–35.4)
MCV: 79 fL — AB (ref 80–97)
MID (cbc): 1 — AB (ref 0–0.9)
MPV: 7.9 fL (ref 0–99.8)
POC Granulocyte: 7 — AB (ref 2–6.9)
POC LYMPH PERCENT: 31 % (ref 10–50)
POC MID %: 8.4 %M (ref 0–12)
Platelet Count, POC: 226 10*3/uL (ref 142–424)
RBC: 5.32 M/uL (ref 4.04–5.48)
RDW, POC: 14.8 %
WBC: 11.6 10*3/uL — AB (ref 4.6–10.2)

## 2016-01-23 LAB — POCT URINALYSIS DIP (MANUAL ENTRY)
Bilirubin, UA: NEGATIVE
Glucose, UA: NEGATIVE
Ketones, POC UA: NEGATIVE
Nitrite, UA: NEGATIVE
Protein Ur, POC: NEGATIVE
Spec Grav, UA: 1.015
Urobilinogen, UA: 0.2
pH, UA: 7

## 2016-01-23 LAB — GLUCOSE, POCT (MANUAL RESULT ENTRY): POC Glucose: 136 mg/dL — AB (ref 70–99)

## 2016-01-23 LAB — TROPONIN I: Troponin I: <0.01 ng/mL (ref <=0.05)

## 2016-01-23 LAB — POCT GLYCOSYLATED HEMOGLOBIN (HGB A1C): Hemoglobin A1C: 10.8

## 2016-01-23 MED ORDER — CEPHALEXIN 500 MG PO CAPS
500.0000 mg | ORAL_CAPSULE | Freq: Two times a day (BID) | ORAL | Status: DC
Start: 2016-01-23 — End: 2016-03-11

## 2016-01-23 NOTE — Patient Instructions (Signed)
Diabetes and Standards of Medical Care Diabetes is complicated. You may find that your diabetes team includes a dietitian, nurse, diabetes educator, eye doctor, and more. To help everyone know what is going on and to help you get the care you deserve, the following schedule of care was developed to help keep you on track. Below are the tests, exams, vaccines, medicines, education, and plans you will need. HbA1c test This test shows how well you have controlled your glucose over the past 2-3 months. It is used to see if your diabetes management plan needs to be adjusted.   It is performed at least 2 times a year if you are meeting treatment goals.  It is performed 4 times a year if therapy has changed or if you are not meeting treatment goals. Blood pressure test  This test is performed at every routine medical visit. The goal is less than 140/90 mm Hg for most people, but 130/80 mm Hg in some cases. Ask your health care provider about your goal. Dental exam  Follow up with the dentist regularly. Eye exam  If you are diagnosed with type 1 diabetes as a child, get an exam upon reaching the age of 80 years or older and having had diabetes for 3-5 years. Yearly eye exams are recommended after that initial eye exam.  If you are diagnosed with type 1 diabetes as an adult, get an exam within 5 years of diagnosis and then yearly.  If you are diagnosed with type 2 diabetes, get an exam as soon as possible after the diagnosis and then yearly. Foot care exam  Visual foot exams are performed at every routine medical visit. The exams check for cuts, injuries, or other problems with the feet.  You should have a complete foot exam performed every year. This exam includes an inspection of the structure and skin of your feet, a check of the pulses in your feet, and a check of the sensation in your feet.  Type 1 diabetes: The first exam is performed 5 years after diagnosis.  Type 2 diabetes: The first  exam is performed at the time of diagnosis.  Check your feet nightly for cuts, injuries, or other problems with your feet. Tell your health care provider if anything is not healing. Kidney function test (urine microalbumin)  This test is performed once a year.  Type 1 diabetes: The first test is performed 5 years after diagnosis.  Type 2 diabetes: The first test is performed at the time of diagnosis.  A serum creatinine and estimated glomerular filtration rate (eGFR) test is done once a year to assess the level of chronic kidney disease (CKD), if present. Lipid profile (cholesterol, HDL, LDL, triglycerides)  Performed every 5 years for most people.  The goal for LDL is less than 100 mg/dL. If you are at high risk, the goal is less than 70 mg/dL.  The goal for HDL is 40 mg/dL-50 mg/dL for men and 50 mg/dL-60 mg/dL for women. An HDL cholesterol of 60 mg/dL or higher gives some protection against heart disease.  The goal for triglycerides is less than 150 mg/dL. Immunizations  The flu (influenza) vaccine is recommended yearly for every person 30 months of age or older who has diabetes.  The pneumonia (pneumococcal) vaccine is recommended for every person 38 years of age or older who has diabetes. Adults 57 years of age or older may receive the pneumonia vaccine as a series of two separate shots.  The hepatitis B  vaccine is recommended for adults shortly after they have been diagnosed with diabetes.  The Tdap (tetanus, diphtheria, and pertussis) vaccine should be given:  According to normal childhood vaccination schedules, for children.  Every 10 years, for adults who have diabetes. Diabetes self-management education  Education is recommended at diagnosis and ongoing as needed. Treatment plan  Your treatment plan is reviewed at every medical visit.   This information is not intended to replace advice given to you by your health care provider. Make sure you discuss any questions you  have with your health care provider.   Document Released: 09/20/2009 Document Revised: 12/14/2014 Document Reviewed: 04/25/2013 Elsevier Interactive Patient Education 2016 Elsevier Inc.  

## 2016-01-23 NOTE — Progress Notes (Signed)
Chief Complaint:  Chief Complaint  Patient presents with  . Nasal Congestion    Started Tuesday night  . Shortness of Breath  . Fatigue  . Chest Pain    HPI: Cheryl Mckee is a 60 y.o. female who reports to [facility] today complaining of:  1. Tuesday night starting feeling bad, she has a heaviness in her chest, she has scracthy throat, she feels like a real bad cold, she left work yesterday and stayed out of work today. She does not feel good at all. Chest heaviness, not chest pain, the heaviness is in the center only with deep breaths She feeels worse when she is laying and in the morning. She feels better when she moves, she felt like this all day. She is not having muscle aches and pain. She has  Been having hot and cold spell sbut she is a hot natured person, chills come and goes. No fever that she is aware of. She has a headache coming on, no ear pain. She is coughing and coughed up some phelgm this morning. She has been tired. Her whole church is sick. She has had some nausea but no emeisis. She had a stomach virus last week, She has not been eating or dinking well, no appetite.She got the flu vaccine in October.  2. She does not know how her diabetes is doing.She has DM, she has been urinating a lot, she has not been drinking as much.  She gets up 3-4 times a night.   3. She is out of blood pressure medicine, she has been without for 1 week. She has refills at the pharmacy but has not picked it up yet. .    Past Medical History  Diagnosis Date  . Fibromyalgia   . Diabetes mellitus without complication (HCC)   . Hypertension   . Urethral diverticulum 2015    evaluated by Dr. Wynelle Link   Past Surgical History  Procedure Laterality Date  . Foot surgery    . Knee surgery    . Colonoscopy  10/05/12  . Cardiac catheterization    . Dilatation & curettage/hysteroscopy with myosure N/A 09/27/2015    Procedure: DILATATION & CURETTAGE/HYSTEROSCOPY WITH MYOSURE;  Surgeon: Dara Lords, MD;  Location: [facility];  Service: Gynecology;  Laterality: N/A;   Social History   Social History  . Marital Status: Married    Spouse Name: N/A  . Number of Children: N/A  . Years of Education: N/A   Social History Main Topics  . Smoking status: Never Smoker   . Smokeless tobacco: Not on file  . Alcohol Use: No  . Drug Use: No  . Sexual Activity: Yes    Birth Control/ Protection: None     Comment: 1st intercourse 60 yo-Fewer than 5 partners   Other Topics Concern  . Not on file   Social History Narrative   Lives with husband in one story home.  No children.   BS degree.   Customer Service at [employer].            Family History  Problem Relation Age of Onset  . Diabetes Mother   . Hypertension Mother   . Diabetes Father   . Cancer Father     Deceased, 70s--Unknown type cancer  . Cancer Maternal Aunt     Ovarian or Uterine  . Breast cancer Cousin     Maternal 1st cousin-Age 38   Allergies  Allergen Reactions  .  Peanut-Containing Drug Products Swelling    NUT Allergy  . Accupril [Quinapril Hcl] Hives  . Daypro [Oxaprozin] Other (See Comments)    GI upset  . Zithromax [Azithromycin] Hives   Prior to Admission medications   Medication Sig Start Date End Date Taking? Authorizing Provider  Insulin Aspart (NOVOLOG [place]) Inject 45 Units into the skin daily.    Yes Historical Provider, MD  Insulin Degludec (TRESIBA FLEXTOUCH [place]) Inject into the skin. 80 units twice a day   Yes Historical Provider, MD  lisinopril (PRINIVIL,ZESTRIL) 40 MG tablet Take 40 mg by mouth daily.   Yes Historical Provider, MD  metFORMIN (GLUCOPHAGE) 500 MG tablet Take 500 mg by mouth 2 (two) times daily with a meal.   Yes Historical Provider, MD  traMADol (ULTRAM) 50 MG tablet Take 50 mg by mouth every 6 (six) hours as needed (pain).   Yes Historical Provider, MD  traZODone (DESYREL) 50 MG tablet Take 50 mg by mouth at bedtime as needed for sleep.    Yes Historical Provider,  MD  acetaminophen (TYLENOL ARTHRITIS PAIN) 650 MG CR tablet Take 650 mg by mouth every 8 (eight) hours as needed for pain. Reported on 01/23/2016    Historical Provider, MD  insulin glargine (LANTUS) 100 UNIT/ML injection Inject 80 Units into the skin 2 (two) times daily. Reported on 01/23/2016    Historical Provider, MD  medroxyPROGESTERone (PROVERA) 10 MG tablet Take 1 tablet (10 mg total) by mouth daily. Patient not taking: Reported on 01/23/2016 10/10/15   Dara Lords, MD     ROS: The patient denies fevers, chills, night sweats, unintentional weight loss,  palpitations, wheezing, dyspnea on exertion, nausea, vomiting, abdominal pain, dysuria, hematuria, melena, numbness,  or tingling.   All other systems have been reviewed and were otherwise negative with the exception of those mentioned in the HPI and as above.    PHYSICAL EXAM: Filed Vitals:   01/23/16 1813  BP: 215/101  Temp: 98 F (36.7 C)   There is no weight on file to calculate BMI.   General: Alert, no acute distress, obese AA female HEENT:  Normocephalic, atraumatic, oropharynx patent. EOMI, PERRLA Erythematous throat, no exudates, TM normal, +/- sinus tenderness, + erythematous/boggy nasal mucosa Cardiovascular:  Regular rate and rhythm, no rubs murmurs or gallops.  No Carotid bruits, radial pulse intact. No pedal edema.  Respiratory: Clear to auscultation bilaterally.  No wheezes, rales, or rhonchi.  No cyanosis, no use of accessory musculature Abdominal: No organomegaly, abdomen is soft and non-tender, positive bowel sounds. No masses. Skin: No rashes. Neurologic: Facial musculature symmetric. Psychiatric: Patient acts appropriately throughout our interaction. Lymphatic: No cervical or submandibular lymphadenopathy Musculoskeletal: Gait intact. No edema, tenderness   LABS: Results for orders placed or performed in visit on 01/23/16  POCT CBC  Result Value Ref Range   WBC 11.6 (A) 4.6 - 10.2 K/uL   Lymph, poc  3.6 (A) 0.6 - 3.4   POC LYMPH PERCENT 31.0 10 - 50 %L   MID (cbc) 1.0 (A) 0 - 0.9   POC MID % 8.4 0 - 12 %M   POC Granulocyte 7.0 (A) 2 - 6.9   Granulocyte percent 60.6 37 - 80 %G   RBC 5.32 4.04 - 5.48 M/uL   Hemoglobin 13.7 12.2 - 16.2 g/dL   HCT, POC 40.9 81.1 - 47.9 %   MCV 79.0 (A) 80 - 97 fL   MCH, POC 25.9 (A) 27 - 31.2 pg   MCHC 32.7 31.8 -  35.4 g/dL   RDW, POC 16.1 %   Platelet Count, POC 226 142 - 424 K/uL   MPV 7.9 0 - 99.8 fL  POCT urinalysis dipstick  Result Value Ref Range   Color, UA yellow yellow   Clarity, UA clear clear   Glucose, UA negative negative   Bilirubin, UA negative negative   Ketones, POC UA negative negative   Spec Grav, UA 1.015    Blood, UA trace-intact (A) negative   pH, UA 7.0    Protein Ur, POC negative negative   Urobilinogen, UA 0.2    Nitrite, UA Negative Negative   Leukocytes, UA Trace (A) Negative  POCT Microscopic Urinalysis ([facility])  Result Value Ref Range   WBC,UR,HPF,POC None None WBC/hpf   RBC,UR,HPF,POC None None RBC/hpf   Bacteria None None, Too numerous to count   Mucus Absent Absent   Epithelial Cells, UR Per Microscopy Few (A) None, Too numerous to count cells/hpf  POCT glucose (manual entry)  Result Value Ref Range   POC Glucose 136 (A) 70 - 99 mg/dl  POCT glycosylated hemoglobin (Hb A1C)  Result Value Ref Range   Hemoglobin A1C 10.8      EKG/XRAY:   Primary read interpreted by Dr. Conley Rolls at [facility].   ASSESSMENT/PLAN: Encounter Diagnoses  Name Primary?  . Chest pressure Yes  . Nausea without vomiting   . Increased urinary frequency   . Type 2 diabetes mellitus without complication, unspecified long term insulin use status (HCC)   . Morbid obesity, unspecified obesity type (HCC)   . Noncompliance   . Other fatigue    She likely has viral upper respiratory  Illness that started several weeks ago and has gone down to chest, complicated by noncomplinace with HTN meds ( has not had meds in 1 week) and also poorly  controlled DM. She does not have an  endocrinologist  But is managed by PCP Dr Nicholos Johns Will Send info to him, [facility].  Stat troponin pending, EKG reassuring We were unable to get CXR due to machine being down  Rx keflex for possible UTI and bacterial infection Urine pending, labs pending.  Fu prn , precautions to go to ER prn   Gross sideeffects, risk and benefits, and alternatives of medications d/w patient. Patient is aware that all medications have potential sideeffects and we are unable to predict every sideeffect or drug-drug interaction that may occur.  Thao Le DO  01/23/2016 7:43 PM

## 2016-01-24 ENCOUNTER — Telehealth: Payer: Self-pay | Admitting: Family Medicine

## 2016-01-24 LAB — COMPLETE METABOLIC PANEL WITH GFR
Alkaline Phosphatase: 115 U/L (ref 33–130)
CO2: 28 mmol/L (ref 20–31)
Chloride: 99 mmol/L (ref 98–110)
GFR, Est African American: >89 mL/min (ref >=60)
GFR, Est Non African American: 79 mL/min (ref >=60)
Potassium: 4.3 mmol/L (ref 3.5–5.3)
Sodium: 137 mmol/L (ref 135–146)
Total Bilirubin: 0.4 mg/dL (ref 0.2–1.2)

## 2016-01-24 LAB — COMPLETE METABOLIC PANEL WITHOUT GFR
ALT: 29 U/L (ref 6–29)
AST: 29 U/L (ref 10–35)
Albumin: 3.6 g/dL (ref 3.6–5.1)
BUN: 12 mg/dL (ref 7–25)
Calcium: 9 mg/dL (ref 8.6–10.4)
Creat: 0.82 mg/dL (ref 0.50–1.05)
Glucose, Bld: 134 mg/dL — ABNORMAL HIGH (ref 65–99)
Total Protein: 7.3 g/dL (ref 6.1–8.1)

## 2016-01-24 NOTE — Telephone Encounter (Signed)
Spoke with patient, Troponin was negative.

## 2016-01-25 ENCOUNTER — Ambulatory Visit (INDEPENDENT_AMBULATORY_CARE_PROVIDER_SITE_OTHER): Payer: BLUE CROSS/BLUE SHIELD | Admitting: Internal Medicine

## 2016-01-25 VITALS — BP 130/80 | HR 84 | Temp 98.3°F | Resp 18 | Ht 68.5 in | Wt 300.1 lb

## 2016-01-25 DIAGNOSIS — J0100 Acute maxillary sinusitis, unspecified: Secondary | ICD-10-CM

## 2016-01-25 LAB — URINE CULTURE: Colony Count: 2000

## 2016-01-25 MED ORDER — HYDROCODONE-HOMATROPINE 5-1.5 MG/5ML PO SYRP: 5.0000 mL | ORAL_SOLUTION | Freq: Four times a day (QID) | ORAL | Status: DC | PRN

## 2016-01-25 MED ORDER — AMOXICILLIN 500 MG PO CAPS: 1000.0000 mg | ORAL_CAPSULE | Freq: Two times a day (BID) | ORAL | Status: AC

## 2016-01-25 NOTE — Progress Notes (Signed)
   Subjective:    Patient ID: Cheryl Mckee, female    DOB: 11-12-1956, 60 y.o.   MRN: [id number] By signing my name below, I, Littie Deeds, attest that this documentation has been prepared under the direction and in the presence of Ellamae Sia, MD.  Electronically Signed: Littie Deeds, Medical Scribe. 01/25/2016. 2:19 PM.  HPI HPI Comments: Cheryl Mckee is a 59 y.o. female with a history of asthma and hay fever who presents to the [facility] for a follow-up. Patient was seen here 2 days ago by Dr. Conley Rolls with chest heaviness/tightness, SOB, dry cough, congestion, sneezing, and headaches. Patient has not improved and is still having those symptoms. She has had symptoms for the past 3 weeks, but this worsened 4 days ago. Last night, she sneezed hard enough to the point that she felt lightheaded/near-syncopal.  Patient denies wheezing, ear pain, and fever.  Review of Systems [place]    Objective:   Physical Exam  Constitutional: She is oriented to person, place, and time. She appears well-developed and well-nourished. No distress.  HENT:  Head: Normocephalic and atraumatic.  Mouth/Throat: Oropharynx is clear and moist. No oropharyngeal exudate.  Eyes: Pupils are equal, round, and reactive to light.  Neck: Neck supple.  Cardiovascular: Normal rate.   Pulmonary/Chest: Effort normal and breath sounds normal. No respiratory distress. She has no wheezes.  Clear to auscultation bilaterally.   Musculoskeletal: She exhibits no edema.  Neurological: She is alert and oriented to person, place, and time. No cranial nerve deficit.  Skin: Skin is warm and dry. No rash noted.  Psychiatric: She has a normal mood and affect. Her behavior is normal.  Nursing note and vitals reviewed. BP 130/80 mmHg  Pulse 84  Temp(Src) 98.3 F (36.8 C) (Oral)  Resp 18  Ht 5' 8.5" (1.74 m)  Wt 300 lb 2 oz (136.136 kg)  BMI 44.96 kg/m2  SpO2 98%  Wt Readings from Last 3 Encounters:  01/25/16 300  lb 2 oz (136.136 kg)  09/20/15 302 lb (136.986 kg)  07/18/15 297 lb (134.718 kg)       Assessment & Plan:  Acute maxillary sinusitis, recurrence not specified  Meds ordered this encounter  Medications  . amoxicillin (AMOXIL) 500 MG capsule    Sig: Take 2 capsules (1,000 mg total) by mouth 2 (two) times daily.    Dispense:  40 capsule    Refill:  0  . HYDROcodone-homatropine (HYCODAN) 5-1.5 MG/5ML syrup    Sig: Take 5 mLs by mouth every 6 (six) hours as needed.    Dispense:  120 mL    Refill:  0     I have completed the patient encounter in its entirety as documented by the scribe, with editing by me where necessary. Robert P. Merla Riches, M.D.

## 2016-02-05 ENCOUNTER — Telehealth: Payer: Self-pay | Admitting: Radiology

## 2016-02-05 NOTE — Telephone Encounter (Signed)
Called, and spoke with the patient, informed of results, and also I faxed the lab results to Dr. Nicholos Johns.

## 2016-03-11 ENCOUNTER — Ambulatory Visit (INDEPENDENT_AMBULATORY_CARE_PROVIDER_SITE_OTHER): Payer: BLUE CROSS/BLUE SHIELD | Admitting: Family Medicine

## 2016-03-11 VITALS — BP 136/84 | HR 75 | Temp 97.8°F | Resp 17 | Ht 69.0 in | Wt 302.0 lb

## 2016-03-11 DIAGNOSIS — J209 Acute bronchitis, unspecified: Secondary | ICD-10-CM | POA: Diagnosis not present

## 2016-03-11 DIAGNOSIS — R5383 Other fatigue: Secondary | ICD-10-CM | POA: Diagnosis not present

## 2016-03-11 LAB — POCT INFLUENZA A/B
Influenza A, POC: NEGATIVE
Influenza B, POC: NEGATIVE

## 2016-03-11 MED ORDER — CEFDINIR 300 MG PO CAPS: 300.0000 mg | ORAL_CAPSULE | Freq: Two times a day (BID) | ORAL | Status: DC

## 2016-03-11 NOTE — Progress Notes (Signed)
   HPI  Patient presents today here with acute illness Has Hx of asthma and hay fever  5-6 days of cough, congestion, fatigue, malaise, and mild dyspnea. Cough is productive of thick white sputum No chest pain Tolerating PO's well   Husband with similar illness, negative flu 4 days ago, onset of his symptoms were 1 day prior Both using OTC cough meds without much improvement She has old hycodan which is helpful  PMH: Smoking status noted ROS: Per HPI  Objective: BP 136/84 mmHg  Pulse 75  Temp(Src) 97.8 F (36.6 C) (Oral)  Resp 17  Ht 5\' 9"  (1.753 m)  Wt 302 lb (136.986 kg)  BMI 44.58 kg/m2  SpO2 98% Gen: NAD, alert, cooperative with exam HEENT: NCAT, R TM obscured by cerumen, L TM WNL. Moist mucosa, no pharyngeal erythema apparent CV: RRR, good S1/S2, no murmur Resp: CTABL, no wheezes, non-labored Ext: No edema, warm Neuro: Alert and oriented, No gross deficits  Assessment and plan:  # acute brinchitis Discussed usual course, likely viral, if no improvement in 2 days start omnicef Had amox 2 months ago 2 days out of work RTC with any worsening symps or failure to improve.       Orders Placed This Encounter  Procedures  . POCT Influenza A/B    Meds ordered this encounter  Medications  . cefdinir (OMNICEF) 300 MG capsule    Sig: Take 1 capsule (300 mg total) by mouth 2 (two) times daily. 1 po BID    Dispense:  20 capsule    Refill:  0    Murtis SinkSam Auguste Tebbetts, MD 03/11/2016, 11:44 AM

## 2016-03-11 NOTE — Patient Instructions (Addendum)
  Great to meet you!  Try to wait until Friday for the antibiotics, if you are getting better do not take them.   Acute Bronchitis Bronchitis is when the airways that extend from the windpipe into the lungs get red, puffy, and painful (inflamed). Bronchitis often causes thick spit (mucus) to develop. This leads to a cough. A cough is the most common symptom of bronchitis. In acute bronchitis, the condition usually begins suddenly and goes away over time (usually in 2 weeks). Smoking, allergies, and asthma can make bronchitis worse. Repeated episodes of bronchitis may cause more lung problems. HOME CARE  Rest.  Drink enough fluids to keep your pee (urine) clear or pale yellow (unless you need to limit fluids as told by your doctor).  Only take over-the-counter or prescription medicines as told by your doctor.  Avoid smoking and secondhand smoke. These can make bronchitis worse. If you are a smoker, think about using nicotine gum or skin patches. Quitting smoking will help your lungs heal faster.  Reduce the chance of getting bronchitis again by:  Washing your hands often.  Avoiding people with cold symptoms.  Trying not to touch your hands to your mouth, nose, or eyes.  Follow up with your doctor as told. GET HELP IF: Your symptoms do not improve after 1 week of treatment. Symptoms include:  Cough.  Fever.  Coughing up thick spit.  Body aches.  Chest congestion.  Chills.  Shortness of breath.  Sore throat. GET HELP RIGHT AWAY IF:   You have an increased fever.  You have chills.  You have severe shortness of breath.  You have bloody thick spit (sputum).  You throw up (vomit) often.  You lose too much body fluid (dehydration).  You have a severe headache.  You faint. MAKE SURE YOU:   Understand these instructions.  Will watch your condition.  Will get help right away if you are not doing well or get worse.   This information is not intended to replace  advice given to you by your health care provider. Make sure you discuss any questions you have with your health care provider.   Document Released: 05/11/2008 Document Revised: 07/26/2013 Document Reviewed: 05/16/2013 Elsevier Interactive Patient Education [employer]2016 Elsevier Inc.

## 2016-07-22 ENCOUNTER — Encounter: Payer: BLUE CROSS/BLUE SHIELD | Admitting: Gynecology

## 2016-07-22 DIAGNOSIS — Z0289 Encounter for other administrative examinations: Secondary | ICD-10-CM

## 2017-02-27 ENCOUNTER — Encounter (HOSPITAL_COMMUNITY): Payer: Self-pay | Admitting: Oncology

## 2017-02-27 ENCOUNTER — Emergency Department (HOSPITAL_COMMUNITY)
Admission: EM | Admit: 2017-02-27 | Discharge: 2017-02-28 | Disposition: A | Discharge status: Home / Self Care | Payer: 59 | Attending: Emergency Medicine | Admitting: Emergency Medicine

## 2017-02-27 DIAGNOSIS — L509 Urticaria, unspecified: Secondary | ICD-10-CM | POA: Diagnosis not present

## 2017-02-27 DIAGNOSIS — Z794 Long term (current) use of insulin: Secondary | ICD-10-CM | POA: Insufficient documentation

## 2017-02-27 DIAGNOSIS — Z91010 Allergy to peanuts: Secondary | ICD-10-CM | POA: Diagnosis not present

## 2017-02-27 DIAGNOSIS — T7840XA Allergy, unspecified, initial encounter: Secondary | ICD-10-CM | POA: Diagnosis not present

## 2017-02-27 DIAGNOSIS — I10 Essential (primary) hypertension: Secondary | ICD-10-CM

## 2017-02-27 DIAGNOSIS — I2510 Atherosclerotic heart disease of native coronary artery without angina pectoris: Secondary | ICD-10-CM | POA: Diagnosis not present

## 2017-02-27 DIAGNOSIS — Z79899 Other long term (current) drug therapy: Secondary | ICD-10-CM | POA: Diagnosis not present

## 2017-02-27 DIAGNOSIS — L500 Allergic urticaria: Secondary | ICD-10-CM | POA: Diagnosis not present

## 2017-02-27 DIAGNOSIS — E119 Type 2 diabetes mellitus without complications: Secondary | ICD-10-CM | POA: Insufficient documentation

## 2017-02-27 HISTORY — DX: Atherosclerotic heart disease of native coronary artery without angina pectoris: I25.10

## 2017-02-27 LAB — CBG MONITORING, ED: Glucose-Capillary: 126 mg/dL — ABNORMAL HIGH (ref 65–99)

## 2017-02-27 MED ORDER — DIPHENHYDRAMINE HCL 50 MG/ML IJ SOLN
25.0000 mg | Freq: Once | INTRAMUSCULAR | Status: AC
  Administered 2017-02-27: 25 mg via INTRAVENOUS
  Filled 2017-02-27: qty 1

## 2017-02-27 MED ORDER — FAMOTIDINE IN NACL 20-0.9 MG/50ML-% IV SOLN
20.0000 mg | Freq: Once | INTRAVENOUS | Status: AC
  Administered 2017-02-27: 20 mg via INTRAVENOUS
  Filled 2017-02-27: qty 50

## 2017-02-27 MED ORDER — METHYLPREDNISOLONE SODIUM SUCC 125 MG IJ SOLR
125.0000 mg | Freq: Once | INTRAMUSCULAR | Status: AC
  Administered 2017-02-27: 125 mg via INTRAVENOUS
  Filled 2017-02-27: qty 2

## 2017-02-27 NOTE — ED Provider Notes (Signed)
[facility]-EMERGENCY DEPT Provider Note   CSN: [id number] Arrival date & time: 02/27/17  2254     History   Chief Complaint Chief Complaint  Patient presents with  . Allergic Reaction    HPI Cheryl Mckee is a 61 y.o. female.  Cheryl Mckee is a 61 y.o. Female who presents to the ED complaining of an allergic reaction. Patient reports she put on mineral ice to her bilateral knees about an hour prior to her reaction. She reports this is the first time she is uses medication. She reports she began feeling itchy and noticed hives throughout her body. She also reports feeling thicker upper lip is swollen. She's had no tongue swelling or difficulty swallowing. She is on lisinopril, but later reveals that she has not taken this in two weeks because she was waiting on a refill.  She denies any changes to her medications. Only new ointment is this mineral ice for pain. She denies new soaps, lotions, perfumes, or detergents. No new food intake. No treatments attempted prior to arrival. Patient denies fevers, trouble swallowing, trouble breathing, shortness of breath, chest pain, nausea, vomiting, abdominal pain, or other complaints.    The history is provided by the patient and medical records. No language interpreter was used.  Allergic Reaction  Presenting symptoms: rash   Presenting symptoms: no wheezing     Past Medical History:  Diagnosis Date  . Coronary artery disease   . Diabetes mellitus without complication (HCC)   . Fibromyalgia   . Hypertension   . Urethral diverticulum 2015   evaluated by Dr. Wynelle Link    Patient Active Problem List   Diagnosis Date Noted  . Morbid obesity (HCC) 06/15/2014  . Obstructive sleep apnea 06/15/2014  . Noncompliance 06/15/2014  . Diabetes mellitus without complication (HCC)   . Depression   . Hypertension   . Cirrhosis, biliary (HCC)   . Fibromyalgia     Past Surgical History:  Procedure Laterality Date  . CARDIAC CATHETERIZATION    .  COLONOSCOPY  10/05/12  . DILATATION & CURETTAGE/HYSTEROSCOPY WITH MYOSURE N/A 09/27/2015   Procedure: DILATATION & CURETTAGE/HYSTEROSCOPY WITH MYOSURE;  Surgeon: Dara Lords, MD;  Location: [facility];  Service: Gynecology;  Laterality: N/A;  . FOOT SURGERY    . KNEE SURGERY      OB History    Gravida Para Term Preterm AB Living   0             SAB TAB Ectopic Multiple Live Births                   Home Medications    Prior to Admission medications   Medication Sig Start Date End Date Taking? Authorizing Provider  cefdinir (OMNICEF) 300 MG capsule Take 1 capsule (300 mg total) by mouth 2 (two) times daily. 1 po BID 03/11/16   Elenora Gamma, MD  diphenhydrAMINE (BENADRYL) 25 MG tablet Take 1 tablet (25 mg total) by mouth every 6 (six) hours as needed for itching (Rash). 02/28/17   Everlene Farrier, PA-C  Insulin Aspart (NOVOLOG [place]) Inject 45 Units into the skin daily.     Historical Provider, MD  Insulin Degludec (TRESIBA FLEXTOUCH [place]) Inject into the skin. 80 units twice a day    Historical Provider, MD  lisinopril (PRINIVIL,ZESTRIL) 40 MG tablet Take 1 tablet (40 mg total) by mouth daily. 02/28/17   Everlene Farrier, PA-C  metFORMIN (GLUCOPHAGE) 500 MG tablet Take 500 mg by mouth 2 (two) times  daily with a meal.    Historical Provider, MD  traMADol (ULTRAM) 50 MG tablet Take 50 mg by mouth every 6 (six) hours as needed (pain).    Historical Provider, MD  traZODone (DESYREL) 50 MG tablet Take 50 mg by mouth at bedtime as needed for sleep.     Historical Provider, MD    Family History Family History  Problem Relation Age of Onset  . Diabetes Mother   . Hypertension Mother   . Diabetes Father   . Cancer Father     Deceased, 70s--Unknown type cancer  . Cancer Maternal Aunt     Ovarian or Uterine  . Breast cancer Cousin     Maternal 1st cousin-Age 60    Social History Social History  Substance Use Topics  . Smoking status: Never Smoker  . Smokeless tobacco: Never Used  .  Alcohol use No     Allergies   Peanut-containing drug products; Accupril [quinapril hcl]; Daypro [oxaprozin]; and Zithromax [azithromycin]   Review of Systems Review of Systems  Constitutional: Negative for chills and fever.  HENT: Positive for facial swelling. Negative for congestion and sore throat.   Eyes: Negative for visual disturbance.  Respiratory: Negative for cough, shortness of breath and wheezing.   Cardiovascular: Negative for chest pain and palpitations.  Gastrointestinal: Negative for abdominal pain, diarrhea, nausea and vomiting.  Genitourinary: Negative for dysuria.  Musculoskeletal: Negative for back pain and neck pain.  Skin: Positive for rash.  Neurological: Negative for headaches.     Physical Exam Updated Vital Signs BP (!) 182/75   Pulse 80   Temp 97.9 F (36.6 C) (Oral)   Resp 18   Ht 5\' 9"  (1.753 m)   Wt 136.1 kg   SpO2 96%   BMI 44.30 kg/m   Physical Exam  Constitutional: She appears well-developed and well-nourished. No distress.  Nontoxic appearing.  HENT:  Head: Normocephalic and atraumatic.  Mouth/Throat: Oropharynx is clear and moist.  No tongue or lip swelling noted. Oropharynx is clear. No drooling. Uvula is midline without edema.  Eyes: Conjunctivae are normal. Pupils are equal, round, and reactive to light. Right eye exhibits no discharge. Left eye exhibits no discharge.  Neck: Neck supple.  Cardiovascular: Normal rate, regular rhythm, normal heart sounds and intact distal pulses.  Exam reveals no gallop and no friction rub.   No murmur heard. Pulmonary/Chest: Effort normal and breath sounds normal. No respiratory distress. She has no wheezes. She has no rales.  Lungs clear to auscultation bilaterally. No increased work of breathing. No rales or rhonchi.  Abdominal: Soft. There is no tenderness.  Musculoskeletal: She exhibits no edema.  Lymphadenopathy:    She has no cervical adenopathy.  Neurological: She is alert. Coordination  normal.  Skin: Skin is warm and dry. Capillary refill takes less than 2 seconds. Rash noted. She is not diaphoretic. No erythema. No pallor.  Hives noted to her bilateral arms, legs, chest, abdomen and back.  Psychiatric: She has a normal mood and affect. Her behavior is normal.  Nursing note and vitals reviewed.    ED Treatments / Results  Labs (all labs ordered are listed, but only abnormal results are displayed) Labs Reviewed  CBG MONITORING, ED - Abnormal; Notable for the following:       Result Value   Glucose-Capillary 126 (*)    All other components within normal limits    EKG  EKG Interpretation None       Radiology No results found.  Procedures  Procedures (including critical care time)  Medications Ordered in ED Medications  famotidine (PEPCID) IVPB 20 mg premix (20 mg Intravenous New Bag/Given 02/27/17 2334)  diphenhydrAMINE (BENADRYL) injection 25 mg (25 mg Intravenous Given 02/27/17 2332)  methylPREDNISolone sodium succinate (SOLU-MEDROL) 125 mg/2 mL injection 125 mg (125 mg Intravenous Given 02/27/17 2331)     Initial Impression / Assessment and Plan / ED Course  I have reviewed the triage vital signs and the nursing notes.  Pertinent labs & imaging results that were available during my care of the patient were reviewed by me and considered in my medical decision making (see chart for details).    This is a 61 y.o. Female who presents to the ED complaining of an allergic reaction. Patient reports she put on mineral ice to her bilateral knees about an hour prior to her reaction. She reports this is the first time she is uses medication. She reports she began feeling itchy and noticed hives throughout her body. She also reports feeling thicker upper lip is swollen. She's had no tongue swelling or difficulty swallowing. She is on lisinopril, but later reveals that she has not taken this in two weeks because she was waiting on a refill.  She denies any changes to  her medications. Only new ointment is this mineral ice for pain. She denies new soaps, lotions, perfumes, or detergents. No new food intake.  On exam the patient is afebrile nontoxic appearing. No tongue or lip swelling noted. Throat is clear. No drooling. Lungs are clear auscultation bilaterally. She has urticaria noted to her bilateral arms, legs, chest, abdomen and back. I have low suspicion for angioedema as the patient revealed she has not been taking lisinopril for 2 weeks and I would not expect her to have hives with angioedema. I also see no evidence of angioedema on exam. Patient responded well to Benadryl, Pepcid and Solu-Medrol. Rash resolved. She's had no trouble breathing. She was observed for 2 hours in the emergency department without changes or any difficulty. We'll discharge at this time and have her continue with Benadryl. I refilled her lisinopril. I discussed strict and specific return precautions. I encouraged her to discontinue using mineral ice. I advised the patient to follow-up with their primary care provider this week. I advised the patient to return to the emergency department with new or worsening symptoms or new concerns. The patient verbalized understanding and agreement with plan.      Final Clinical Impressions(s) / ED Diagnoses   Final diagnoses:  Allergic reaction, initial encounter  Urticaria  Essential hypertension    New Prescriptions New Prescriptions   DIPHENHYDRAMINE (BENADRYL) 25 MG TABLET    Take 1 tablet (25 mg total) by mouth every 6 (six) hours as needed for itching (Rash).     Everlene FarrierWilliam Viet Kemmerer, PA-C 02/28/17 0127    Lavera Guiseana Duo Liu, MD 02/28/17 [phone]

## 2017-02-27 NOTE — ED Triage Notes (Signed)
Pt states that she applied mineral ice to b/l knees tonight for the first time. Approximately 30 minutes after application by developed hives to arms, legs and chest.  Denies Shob at this time however reports that initially she felt shob.  Pt has swelling to upper lip.  Did not take any benadryl PTA.

## 2017-02-28 MED ORDER — LISINOPRIL 40 MG PO TABS: 40.0000 mg | ORAL_TABLET | Freq: Every day | ORAL | 0 refills | Status: DC

## 2017-02-28 MED ORDER — DIPHENHYDRAMINE HCL 25 MG PO TABS: 25.0000 mg | ORAL_TABLET | Freq: Four times a day (QID) | ORAL | 1 refills | Status: DC | PRN

## 2017-03-10 DIAGNOSIS — N390 Urinary tract infection, site not specified: Secondary | ICD-10-CM | POA: Diagnosis not present

## 2017-03-10 DIAGNOSIS — E782 Mixed hyperlipidemia: Secondary | ICD-10-CM | POA: Diagnosis not present

## 2017-03-10 DIAGNOSIS — E1165 Type 2 diabetes mellitus with hyperglycemia: Secondary | ICD-10-CM | POA: Diagnosis not present

## 2017-03-17 DIAGNOSIS — Z0000 Encounter for general adult medical examination without abnormal findings: Secondary | ICD-10-CM | POA: Diagnosis not present

## 2017-03-17 DIAGNOSIS — E1165 Type 2 diabetes mellitus with hyperglycemia: Secondary | ICD-10-CM | POA: Diagnosis not present

## 2017-03-17 DIAGNOSIS — E782 Mixed hyperlipidemia: Secondary | ICD-10-CM | POA: Diagnosis not present

## 2017-05-12 DIAGNOSIS — E039 Hypothyroidism, unspecified: Secondary | ICD-10-CM | POA: Diagnosis not present

## 2017-07-16 DIAGNOSIS — E1165 Type 2 diabetes mellitus with hyperglycemia: Secondary | ICD-10-CM | POA: Diagnosis not present

## 2017-07-16 DIAGNOSIS — E039 Hypothyroidism, unspecified: Secondary | ICD-10-CM | POA: Diagnosis not present

## 2017-08-10 DIAGNOSIS — E782 Mixed hyperlipidemia: Secondary | ICD-10-CM | POA: Diagnosis not present

## 2017-08-10 DIAGNOSIS — E1165 Type 2 diabetes mellitus with hyperglycemia: Secondary | ICD-10-CM | POA: Diagnosis not present

## 2017-08-10 DIAGNOSIS — E1142 Type 2 diabetes mellitus with diabetic polyneuropathy: Secondary | ICD-10-CM | POA: Diagnosis not present

## 2017-10-05 ENCOUNTER — Ambulatory Visit (INDEPENDENT_AMBULATORY_CARE_PROVIDER_SITE_OTHER): Payer: 59 | Admitting: Physician Assistant

## 2017-10-05 ENCOUNTER — Encounter: Payer: Self-pay | Admitting: Physician Assistant

## 2017-10-05 VITALS — BP 150/100 | HR 73 | Temp 98.4°F | Resp 16 | Ht 69.0 in | Wt 299.8 lb

## 2017-10-05 DIAGNOSIS — M7918 Myalgia, other site: Secondary | ICD-10-CM | POA: Diagnosis not present

## 2017-10-05 DIAGNOSIS — M25562 Pain in left knee: Secondary | ICD-10-CM | POA: Diagnosis not present

## 2017-10-05 MED ORDER — CYCLOBENZAPRINE HCL 10 MG PO TABS: 10.0000 mg | ORAL_TABLET | Freq: Three times a day (TID) | ORAL | 0 refills | Status: DC | PRN

## 2017-10-05 MED ORDER — MELOXICAM 7.5 MG PO TABS: 7.5000 mg | ORAL_TABLET | Freq: Every day | ORAL | 0 refills | Status: DC

## 2017-10-05 NOTE — Patient Instructions (Addendum)
  Meloxicam is an NSAID. Do not use with any other otc pain medication other than tylenol/acetaminophen - so no aleve, ibuprofen, motrin, advil, etc. Flexeril is a muscle relaxer. This may make you drowsy.  You may take both these medications at the same time if needed.  Stretch your neck.  Use ice and/or heat as needed for pain.  Come back and see me in 2-3 weeks if you are not improving.   Thank you for coming in today. I hope you feel we met your needs.  Feel free to call PCP if you have any questions or further requests.  Please consider signing up for MyChart if you do not already have it, as this is a great way to communicate with me.  Best,  Whitney McVey, PA-C   IF you received an x-ray today, you will receive an invoice from [facility]. Please contact [facility] at [phone] with questions or concerns regarding your invoice.   IF you received labwork today, you will receive an invoice from [facility]. Please contact [facility] at [phone] with questions or concerns regarding your invoice.   Our billing staff will not be able to assist you with questions regarding bills from these companies.  You will be contacted with the lab results as soon as they are available. The fastest way to get your results is to activate your My Chart account. Instructions are located on the last page of this paperwork. If you have not heard from [place] regarding the results in 2 weeks, please contact this office.

## 2017-10-05 NOTE — Progress Notes (Signed)
Cheryl Mckee  MRN: [id number] DOB: 04-23-1956  PCP: Georgianne Fick, MD  Subjective:  Pt is a 61 year old female PMH fibromyalgia, morbid obesity, HTN and DM who presents to clinic for back pain and headache x 6 days. She slipped on her kitchen floor last week while she was mopping "I went down in a split then hit the back of my head on the floor" C/o headache in the back of her head, left knee pain and muscle soreness. No LOC. Denies dizziness, nausea, vomiting, visual disturbance, difficulty concentrating, gait abnormality. She has not taken anything to feel better. She is not on anticoagulation therapy.   Review of Systems  Eyes: Negative for photophobia and visual disturbance.  Gastrointestinal: Negative for nausea and vomiting.  Musculoskeletal: Positive for arthralgias, back pain, gait problem, myalgias and neck pain. Negative for joint swelling and neck stiffness.  Skin: Negative for wound.  Neurological: Positive for headaches. Negative for dizziness, weakness and light-headedness.  Psychiatric/Behavioral: Negative for agitation, behavioral problems, confusion, decreased concentration and sleep disturbance. The patient is not nervous/anxious.     Patient Active Problem List   Diagnosis Date Noted  . Morbid obesity (HCC) 06/15/2014  . Obstructive sleep apnea 06/15/2014  . Noncompliance 06/15/2014  . Diabetes mellitus without complication (HCC)   . Depression   . Hypertension   . Cirrhosis, biliary (HCC)   . Fibromyalgia     Current Outpatient Prescriptions on File Prior to Visit  Medication Sig Dispense Refill  . Insulin Aspart (NOVOLOG [place]) Inject 45 Units into the skin daily.     . Insulin Degludec (TRESIBA FLEXTOUCH [place]) Inject into the skin. 80 units twice a day    . lisinopril (PRINIVIL,ZESTRIL) 40 MG tablet Take 1 tablet (40 mg total) by mouth daily. 30 tablet 0  . metFORMIN (GLUCOPHAGE) 500 MG tablet Take 500 mg by mouth 2 (two) times daily with a meal.    .  traMADol (ULTRAM) 50 MG tablet Take 50 mg by mouth every 6 (six) hours as needed (pain).    . traZODone (DESYREL) 50 MG tablet Take 50 mg by mouth at bedtime as needed for sleep.      No current facility-administered medications on file prior to visit.     Allergies  Allergen Reactions  . Peanut-Containing Drug Products Swelling    NUT Allergy  . Accupril [Quinapril Hcl] Hives  . Daypro [Oxaprozin] Other (See Comments)    GI upset  . Zithromax [Azithromycin] Hives     Objective:  BP (!) 150/100   Pulse 73   Temp 98.4 F (36.9 C) (Oral)   Resp 16   Ht 5\' 9"  (1.753 m)   Wt 299 lb 12.8 oz (136 kg)   SpO2 100%   BMI 44.27 kg/m   Physical Exam  Constitutional: She is oriented to person, place, and time and well-developed, well-nourished, and in no distress. No distress.  Obese  Neck: Normal range of motion and full passive range of motion without pain.    TTP occipital bone b/l.   Cardiovascular: Normal rate, regular rhythm and normal heart sounds.   Neurological: She is alert and oriented to person, place, and time. GCS score is 15.  Skin: Skin is warm and dry.  Psychiatric: Mood, memory, affect and judgment normal.  Vitals reviewed.   Assessment and Plan :  1. Musculoskeletal pain 2. Acute pain of left knee - cyclobenzaprine (FLEXERIL) 10 MG tablet; Take 1 tablet (10 mg total) by mouth 3 (three)  times daily as needed for muscle spasms.  Dispense: 30 tablet; Refill: 0 - meloxicam (MOBIC) 7.5 MG tablet; Take 1 tablet (7.5 mg total) by mouth daily. Max dose 15mg /day  Dispense: 30 tablet; Refill: 0 - No concern at this time for neurological complication. Pain due to MSK 2/2 fall. Plan to treat supportively, discussed heat, ice, massage, stretching. RTC in 2-3 weeks if no improvement.   Marco CollieWhitney Nakeda Lebron, PA-C  Primary Care at [facility] 10/05/2017 10:09 AM

## 2017-10-26 DIAGNOSIS — M1712 Unilateral primary osteoarthritis, left knee: Secondary | ICD-10-CM | POA: Diagnosis not present

## 2017-12-13 ENCOUNTER — Encounter: Payer: Self-pay | Admitting: Physician Assistant

## 2017-12-13 ENCOUNTER — Other Ambulatory Visit: Payer: Self-pay

## 2017-12-13 ENCOUNTER — Ambulatory Visit (INDEPENDENT_AMBULATORY_CARE_PROVIDER_SITE_OTHER): Payer: 59

## 2017-12-13 ENCOUNTER — Ambulatory Visit: Payer: 59 | Admitting: Physician Assistant

## 2017-12-13 VITALS — BP 186/99 | HR 81 | Temp 97.6°F | Resp 16 | Ht 69.02 in | Wt 294.6 lb

## 2017-12-13 DIAGNOSIS — R0981 Nasal congestion: Secondary | ICD-10-CM

## 2017-12-13 DIAGNOSIS — R05 Cough: Secondary | ICD-10-CM

## 2017-12-13 DIAGNOSIS — R21 Rash and other nonspecific skin eruption: Secondary | ICD-10-CM

## 2017-12-13 DIAGNOSIS — J069 Acute upper respiratory infection, unspecified: Secondary | ICD-10-CM | POA: Diagnosis not present

## 2017-12-13 DIAGNOSIS — R52 Pain, unspecified: Secondary | ICD-10-CM | POA: Diagnosis not present

## 2017-12-13 DIAGNOSIS — R059 Cough, unspecified: Secondary | ICD-10-CM

## 2017-12-13 DIAGNOSIS — I10 Essential (primary) hypertension: Secondary | ICD-10-CM | POA: Diagnosis not present

## 2017-12-13 LAB — POCT CBC
Granulocyte percent: 68.9 %G (ref 37–80)
HCT, POC: 41.3 % (ref 37.7–47.9)
HEMOGLOBIN: 13.4 g/dL (ref 12.2–16.2)
Lymph, poc: 2.1 (ref 0.6–3.4)
MCH: 25.8 pg — AB (ref 27–31.2)
MCHC: 32.5 g/dL (ref 31.8–35.4)
MCV: 79.5 fL — AB (ref 80–97)
MID (cbc): 0.5 (ref 0–0.9)
MPV: 7.9 fL (ref 0–99.8)
PLATELET COUNT, POC: 252 10*3/uL (ref 142–424)
POC Granulocyte: 5.7 (ref 2–6.9)
POC LYMPH PERCENT: 25.5 %L (ref 10–50)
POC MID %: 5.6 %M (ref 0–12)
RBC: 5.2 M/uL (ref 4.04–5.48)
RDW, POC: 15.2 %
WBC: 8.3 10*3/uL (ref 4.6–10.2)

## 2017-12-13 LAB — POC INFLUENZA A&B (BINAX/QUICKVUE)
INFLUENZA B, POC: NEGATIVE
Influenza A, POC: NEGATIVE

## 2017-12-13 MED ORDER — OXYMETAZOLINE HCL 0.05 % NA SOLN: 1.0000 | Freq: Two times a day (BID) | NASAL | 0 refills | Status: DC

## 2017-12-13 MED ORDER — IPRATROPIUM BROMIDE 0.03 % NA SOLN: 2.0000 | Freq: Two times a day (BID) | NASAL | 0 refills | Status: DC

## 2017-12-13 MED ORDER — GUAIFENESIN ER 1200 MG PO TB12: 1.0000 | ORAL_TABLET | Freq: Two times a day (BID) | ORAL | 1 refills | Status: DC | PRN

## 2017-12-13 MED ORDER — CLOTRIMAZOLE-BETAMETHASONE 1-0.05 % EX CREA: 1.0000 "application " | TOPICAL_CREAM | Freq: Two times a day (BID) | CUTANEOUS | 0 refills | Status: DC

## 2017-12-13 MED ORDER — BENZONATATE 100 MG PO CAPS: 100.0000 mg | ORAL_CAPSULE | Freq: Three times a day (TID) | ORAL | 0 refills | Status: DC | PRN

## 2017-12-13 MED ORDER — FLUTICASONE PROPIONATE 50 MCG/ACT NA SUSP: 2.0000 | Freq: Every day | NASAL | 6 refills | Status: DC

## 2017-12-13 NOTE — Progress Notes (Signed)
MRN: [id number] DOB: July 31, 1956  Subjective:   Cheryl Mckee is a 62 y.o. female presenting for chief complaint of Rash (X 2 weeks on face and neck and upper chest) and Nasal Congestion (X 2 days with sob, headache) .  Reports 5 day history of illness. Started out with body aches. Then developed nasal congestion, runny nose, and sore throat. It then settled into her chest about 3 days ago. It is productive. She is blowing out and spitting out yellow mucous. She is having difficulty breathing due to the nasal congestion. Denies fever, chills, diaphoresis, chest pain, ear pain and difficulty swallowing, night sweats, chills, nausea, vomiting, abdominal pain and diarrhea. Has not tried anything for relief. Has not had sick contact with anyone. No history of seasonal allergies, no history of asthma or COPD. Patient has had flu shot this season. Denies smoking.  Also would like to talk about rash involving the chest, face and neck. Rash started 1 week ago. Lesions are red, and raised in texture. Rash has not changed over time. Rash is pruritic. Associated symptoms: head cold symptoms.  Patient denies: abdominal pain, arthralgia, decrease in appetite, fever, headache, irritability and vomiting. Patient has not had contacts with similar rash. Patient has not had new exposures (soaps, lotions, laundry detergents, foods, medications, plants, insects or animals).  In terms of elevated bp, pt has dx of HTN.  Has prescription for lisinopril 90m tablets.  She does not take these daily.  Maybe takes it once or twice a week.  Denies chest pain, headache, hematuria, diaphoresis, nausea, vomiting, abdominal pain, lower leg swelling.  Cheryl Mckee a current medication list which includes the following prescription(s): cyclobenzaprine, insulin aspart, insulin degludec, lisinopril, meloxicam, metformin, tramadol, trazodone, benzonatate, clotrimazole-betamethasone, guaifenesin, and ipratropium. Also is allergic to  peanut-containing drug products; accupril [quinapril hcl]; daypro [oxaprozin]; and zithromax [azithromycin].  [name] has a past medical history of Coronary artery disease, [place] complication [place], Fibromyalgia, Hypertension, and Urethral diverticulum (2015). Also  has a past surgical history that includes Foot surgery; Knee surgery; Colonoscopy (10/05/12); Cardiac catheterization; and Dilatation & curettage/hysteroscopy with myosure (N/A, 09/27/2015).   Objective:   Vitals: BP (!) 186/99 (BP Location: Left Arm, Patient Position: Sitting, Cuff Size: Large)   Pulse 81   Temp 97.6 F (36.4 C) (Oral)   Resp 16   Ht 5' 9.02" (1.753 m)   Wt 294 lb 9.6 oz (133.6 kg)   SpO2 99%   BMI 43.48 kg/m   Physical Exam  Constitutional: She is oriented to person, place, and time. She appears well-developed and well-nourished. She appears distressed (appears like she does not feel well ).  HENT:  Head: Normocephalic and atraumatic.  Eyes: Conjunctivae are normal. Pupils are equal, round, and reactive to light.  Neck: Normal range of motion.  Cardiovascular: Normal rate, regular rhythm, normal heart sounds and intact distal pulses.  Pulmonary/Chest: Effort normal and breath sounds normal. She has no wheezes. She has no rhonchi. She has no rales.  Lung exam difficult due to body habitus and coughing noted throughout exam.   Musculoskeletal:       Right lower leg: She exhibits no swelling.       Left lower leg: She exhibits no swelling.  Neurological: She is alert and oriented to person, place, and time.  Skin: Skin is warm and dry. Rash (multiple erythematous patches and few urticaria noted on anterior upper trunk neck, and one on face.  ) noted.  Psychiatric: She  has a normal mood and affect.  Vitals reviewed.   Results for orders placed or performed in visit on 12/13/17 (from the past 24 hour(s))  POCT CBC     Status: Abnormal   Collection Time: 12/13/17 11:12 AM  Result  Value Ref Range   WBC 8.3 4.6 - 10.2 K/uL   Lymph, poc 2.1 0.6 - 3.4   POC LYMPH PERCENT 25.5 10 - 50 %L   MID (cbc) 0.5 0 - 0.9   POC MID % 5.6 0 - 12 %M   POC Granulocyte 5.7 2 - 6.9   Granulocyte percent 68.9 37 - 80 %G   RBC 5.20 4.04 - 5.48 M/uL   Hemoglobin 13.4 12.2 - 16.2 g/dL   HCT, POC 41.3 37.7 - 47.9 %   MCV 79.5 (A) 80 - 97 fL   MCH, POC 25.8 (A) 27 - 31.2 pg   MCHC 32.5 31.8 - 35.4 g/dL   RDW, POC 15.2 %   Platelet Count, POC 252 142 - 424 K/uL   MPV 7.9 0 - 99.8 fL  POC Influenza A&B(BINAX/QUICKVUE)     Status: None   Collection Time: 12/13/17 11:29 AM  Result Value Ref Range   Influenza A, POC Negative Negative   Influenza B, POC Negative Negative   Dg Chest 2 View  Result Date: 12/13/2017 CLINICAL DATA:  sob and producitve cough x 3 days, lung exam difficult due to body habitus and coughing. EXAM: CHEST  2 VIEW COMPARISON:  None. FINDINGS: Normal mediastinum and cardiac silhouette. Normal pulmonary vasculature. No evidence of effusion, infiltrate, or pneumothorax. No acute bony abnormality. Degenerative osteophytosis of the spine. IMPRESSION: No acute cardiopulmonary process. Electronically Signed   By: Suzy Bouchard M.D.   On: 12/13/2017 11:06    Assessment and Plan :  1. Cough - DG Chest 2 View; Future - POCT CBC - POC Influenza A&B(BINAX/QUICKVUE) - benzonatate (TESSALON) 100 MG capsule; Take 1-2 capsules (100-200 mg total) by mouth 3 (three) times daily as needed for cough.  Dispense: 40 capsule; Refill: 0 - Guaifenesin (MUCINEX MAXIMUM STRENGTH) 1200 MG TB12; Take 1 tablet (1,200 mg total) by mouth every 12 (twelve) hours as needed.  Dispense: 14 tablet; Refill: 1 2. Body aches - DG Chest 2 View; Future - POCT CBC - POC Influenza A&B(BINAX/QUICKVUE) 3. Nasal congestion - ipratropium (ATROVENT) 0.03 % nasal spray; Place 2 sprays into both nostrils 2 (two) times daily.  Dispense: 30 mL; Refill: 0 4. Acute upper respiratory infection History and physical  exam findings consistent with URI. Vitals stable. WBC WNL. CXR with no acute findings.  Likely viral etiology.  Patient has not tried any symptomatic medication.  Plan for symptomatic treatment at this time.  Advised to return to clinic if symptoms worsen, do not improve in 5-7 days, or as needed.  5. Hypertension, unspecified type Asymptomatic.  Likely due to the fact that patient is noncompliant with her medication.  Educated on potential side effects of uncontrolled bp at this value. Strongly encouraged to start taking blood pressure meds as prescribed.  Educated check bp outside of office over the next couple of weeks and document these values.  Goal is <140/90.  Return in 2 weeks after being on medication as prescribed for further evaluation.  Given strict ED precautions.  - CMP14+EGFR  6. Rash and nonspecific skin eruption  DDx includes tinea vs allergic dermatitis vs contact dermatitis. Few lesions look fungal in natures, while others appear more hive like.  Will treat  with topical lotrisone at this time. Also encouraged to pick up OTC zyrtec and zantac to use. Advised to return to clinic if symptoms worsen, do not improve, or as needed. - clotrimazole-betamethasone (LOTRISONE) cream; Apply 1 application topically 2 (two) times daily.  Dispense: 30 g; Refill: 0  Tenna Delaine, PA-C  Primary Care at [facility] 12/13/2017 1:39 PM

## 2017-12-13 NOTE — Progress Notes (Deleted)
   Subjective:     Cheryl Mckee is a 62 y.o. female who presents for evaluation of a rash involving the {body part:32401}. Rash started {1-10:13787} {time; units:19136} ago. Lesions are {color:5006}, and {text:16500} in texture. Rash {has/not:18111} changed over time. Rash {rash dc:16501}. Associated symptoms: {rash assoc:16502}. Patient denies: {rash assoc:19565}. Patient {has/not:18111} had contacts with similar rash. Patient {has/not:18111} had new exposures (soaps, lotions, laundry detergents, foods, medications, plants, insects or animals).  {Common ambulatory SmartLinks:19316}  Review of Systems {ros; complete:30496}    Objective:    BP (!) 170/90 (BP Location: Left Arm, Patient Position: Sitting, Cuff Size: Large)   Pulse 81   Temp 97.6 F (36.4 C) (Oral)   Resp 16   Ht 5' 9.02" (1.753 m)   Wt 294 lb 9.6 oz (133.6 kg)   SpO2 99%   BMI 43.48 kg/m  General:  {gen appearance:16600}  Skin:  {skin exam:30902::"normal"}     Assessment:    {derm diagnosis:16511}    Plan:    {EXBM:84132}{plan:18774}

## 2017-12-13 NOTE — Patient Instructions (Addendum)
-Your white blood cell count was normal, your flu test was negative, and your chest x-ray showed no acute processes.  We will treat this as a respiratory viral infection.  - I recommend you rest, drink plenty of fluids, eat light meals including soups.  -For your cough, I have given you 2 medications.  Use Mucinex when he feel like he want to cough the congestion up.  Use Tessalon Perles when you want to suppress the cough. -You may use Atrovent nasal spray for runny nose and congestion. - You may also use Tylenol or ibuprofen over-the-counter for your sore throat.  - Please let me know if you are not seeing any improvement or get worse in 5-7 days.  In terms of rash, I am going to give you a topical ointment to use.  This contains both an antifungal and steroid cream in it.  I also recommend using over-the-counter Zyrtec and Zantac for itching.  Please return if your rash worsens or does not improve in 7-10 days.  In terms of her elevated blood pressure, I recommend you start taking your blood pressure medication daily as prescribed.  We have checked some labs today.  You should return in 2 weeks after you have been on your blood pressure consistently so we can recheck your values in office.  I would like you to check your blood pressure at least a couple times over the next week outside of the office and document these values. It is best if you check the blood pressure at different times in the day. Your goal is <140/90.If you start to have chest pain, blurred vision, shortness of breath, severe headache, lower leg swelling, or nausea/vomiting please seek care immediately here or at the ED.   Thank you for letting me participate in your health and well being.  Upper Respiratory Infection, Adult Most upper respiratory infections (URIs) are caused by a virus. A URI affects the nose, throat, and upper air passages. The most common type of URI is often called "the common cold." Follow these instructions  at home:  Take medicines only as told by your doctor.  Gargle warm saltwater or take cough drops to comfort your throat as told by your doctor.  Use a warm mist humidifier or inhale steam from a shower to increase air moisture. This may make it easier to breathe.  Drink enough fluid to keep your pee (urine) clear or pale yellow.  Eat soups and other clear broths.  Have a healthy diet.  Rest as needed.  Go back to work when your fever is gone or your doctor says it is okay. ? You may need to stay home longer to avoid giving your URI to others. ? You can also wear a face mask and wash your hands often to prevent spread of the virus.  Use your inhaler more if you have asthma.  Do not use any tobacco products, including cigarettes, chewing tobacco, or electronic cigarettes. If you need help quitting, ask your doctor. Contact a doctor if:  You are getting worse, not better.  Your symptoms are not helped by medicine.  You have chills.  You are getting more short of breath.  You have brown or red mucus.  You have yellow or brown discharge from your nose.  You have pain in your face, especially when you bend forward.  You have a fever.  You have puffy (swollen) neck glands.  You have pain while swallowing.  You have white areas in  the back of your throat. Get help right away if:  You have very bad or constant: ? Headache. ? Ear pain. ? Pain in your forehead, behind your eyes, and over your cheekbones (sinus pain). ? Chest pain.  You have long-lasting (chronic) lung disease and any of the following: ? Wheezing. ? Long-lasting cough. ? Coughing up blood. ? A change in your usual mucus.  You have a stiff neck.  You have changes in your: ? Vision. ? Hearing. ? Thinking. ? Mood. This information is not intended to replace advice given to you by your health care provider. Make sure you discuss any questions you have with your health care provider. Document  Released: 05/11/2008 Document Revised: 07/26/2016 Document Reviewed: 02/28/2014 Elsevier Interactive Patient Education  2018 [employer]      IF you received an x-ray today, you will receive an invoice from [facility]. Please contact [facility] at [phone] with questions or concerns regarding your invoice.   IF you received labwork today, you will receive an invoice from [facility]. Please contact [facility] at [phone] with questions or concerns regarding your invoice.   Our billing staff will not be able to assist you with questions regarding bills from these companies.  You will be contacted with the lab results as soon as they are available. The fastest way to get your results is to activate your My Chart account. Instructions are located on the last page of this paperwork. If you have not heard from us regarding the results in 2 weeks, please contact this office.

## 2017-12-15 LAB — CMP14+EGFR

## 2017-12-17 DIAGNOSIS — R05 Cough: Secondary | ICD-10-CM | POA: Diagnosis not present

## 2017-12-17 NOTE — Addendum Note (Signed)
Addended by: Rogelia RohrerMCADOO, Aarish Rockers K on: 12/17/2017 09:54 AM   Modules accepted: Orders

## 2017-12-18 LAB — CMP14+EGFR
ALK PHOS: 141 IU/L — AB (ref 39–117)
ALT: 36 IU/L — AB (ref 0–32)
AST: 44 IU/L — ABNORMAL HIGH (ref 0–40)
Albumin/Globulin Ratio: 1.1 — ABNORMAL LOW (ref 1.2–2.2)
Albumin: 4 g/dL (ref 3.6–4.8)
BUN/Creatinine Ratio: 9 — ABNORMAL LOW (ref 12–28)
BUN: 10 mg/dL (ref 8–27)
CHLORIDE: 99 mmol/L (ref 96–106)
CO2: 25 mmol/L (ref 20–29)
CREATININE: 1.14 mg/dL — AB (ref 0.57–1.00)
Calcium: 9.2 mg/dL (ref 8.7–10.3)
GFR calc non Af Amer: 52 mL/min/{1.73_m2} — ABNORMAL LOW (ref >59)
GFR, EST AFRICAN AMERICAN: 60 mL/min/{1.73_m2} (ref >59)
GLUCOSE: 219 mg/dL — AB (ref 65–99)
Globulin, Total: 3.5 g/dL (ref 1.5–4.5)
Potassium: 4.3 mmol/L (ref 3.5–5.2)
Sodium: 142 mmol/L (ref 134–144)
TOTAL PROTEIN: 7.5 g/dL (ref 6.0–8.5)

## 2017-12-23 ENCOUNTER — Other Ambulatory Visit: Payer: Self-pay | Admitting: Physician Assistant

## 2017-12-23 MED ORDER — DOXYCYCLINE HYCLATE 100 MG PO CAPS: 100.0000 mg | ORAL_CAPSULE | Freq: Two times a day (BID) | ORAL | 0 refills | Status: DC

## 2017-12-23 NOTE — Progress Notes (Signed)
Meds ordered this encounter  Medications  . doxycycline (VIBRAMYCIN) 100 MG capsule    Sig: Take 1 capsule (100 mg total) by mouth 2 (two) times daily.    Dispense:  20 capsule    Refill:  0    Order Specific Question:   Supervising Provider    Answer:   SMITH, KRISTI M [[id number]]     

## 2017-12-30 ENCOUNTER — Other Ambulatory Visit: Payer: Self-pay

## 2017-12-30 ENCOUNTER — Ambulatory Visit: Payer: 59 | Admitting: Physician Assistant

## 2017-12-30 ENCOUNTER — Encounter: Payer: Self-pay | Admitting: Physician Assistant

## 2017-12-30 ENCOUNTER — Ambulatory Visit (INDEPENDENT_AMBULATORY_CARE_PROVIDER_SITE_OTHER): Payer: 59 | Admitting: Physician Assistant

## 2017-12-30 VITALS — BP 166/88 | HR 78 | Temp 97.9°F | Resp 18 | Ht 68.9 in | Wt 299.6 lb

## 2017-12-30 DIAGNOSIS — R748 Abnormal levels of other serum enzymes: Secondary | ICD-10-CM | POA: Diagnosis not present

## 2017-12-30 DIAGNOSIS — R05 Cough: Secondary | ICD-10-CM

## 2017-12-30 DIAGNOSIS — R7989 Other specified abnormal findings of blood chemistry: Secondary | ICD-10-CM | POA: Diagnosis not present

## 2017-12-30 DIAGNOSIS — I10 Essential (primary) hypertension: Secondary | ICD-10-CM

## 2017-12-30 DIAGNOSIS — R059 Cough, unspecified: Secondary | ICD-10-CM

## 2017-12-30 MED ORDER — BENZONATATE 100 MG PO CAPS: 100.0000 mg | ORAL_CAPSULE | Freq: Three times a day (TID) | ORAL | 0 refills | Status: DC | PRN

## 2017-12-30 MED ORDER — AMLODIPINE BESYLATE 5 MG PO TABS: 5.0000 mg | ORAL_TABLET | Freq: Every day | ORAL | 1 refills | Status: DC

## 2017-12-30 MED ORDER — GUAIFENESIN ER 1200 MG PO TB12: 1.0000 | ORAL_TABLET | Freq: Two times a day (BID) | ORAL | 0 refills | Status: DC | PRN

## 2017-12-30 NOTE — Progress Notes (Signed)
MRN: [id number] DOB: [birth year]  Subjective:   Cheryl Mckee is a 62 y.o. female presenting for follow up on cough and HTN.   In terms of cough, patient was last seen on 12/13/17.  She was having cough, body aches, nasal congestion.  Chest x-ray was negative.  Treated for acute upper respiratory infection with symptomatic treatment.  When I contacted her about her lab results, she notify me she was not feeling any better and she was then having a productive cough.  I therefore gave her prescription for doxycycline.  She started that about 5 days ago.  Notes she feels 50% better.  She still having occasional cough and wheezing.  She ran out of Mucinex and [employer] which were really helping with her cough.  She has taken Delsym cough syrup at nighttime which is helping her sleep.  She denies fever, chills, shortness of breath, chest pain, sinus pain, generalized body aches, nausea and vomiting.  In terms of high blood pressure, at her last visit her BP was 186/99.  She has a prescription for lisinopril 40 mg but informed me that she was not taking it.  She was a symptom attic.  Encouraged her to take her medication and follow-up in 2 weeks.  She has a PCP but she missed her appointment due to her illness so she followed up here.  She is not checking blood pressure at home. Reports she started taking lisinopril and is tolerating the medication well. Denies lightheadedness, dizziness, chronic headache, double vision, chest pain, shortness of breath, heart racing, palpitations, nausea, vomiting, abdominal pain, hematuria, lower leg swelling. She denies smoking.  Her CMP 2 weeks ago showed mildly elevated creatinine and liver enzymes.  She was encouraged to avoid NSAIDs, Tylenol, alcohol, and follow-up with her PCP for repeat labs.  Notes she cannot get in with her new PCP so she would like to have them drawn here today.    Cheryl Mckee has a current medication list which includes the following  prescription(s): benzonatate, clotrimazole-betamethasone, cyclobenzaprine, doxycycline, guaifenesin, insulin aspart, insulin degludec, ipratropium, lisinopril, meloxicam, metformin, tramadol, trazodone, and amlodipine. Also is allergic to peanut-containing drug products; accupril [quinapril hcl]; daypro [oxaprozin]; and zithromax [azithromycin].  Cheryl Mckee  has a past medical history of Coronary artery disease, Diabetes mellitus without complication ([place]), Fibromyalgia, Hypertension, and Urethral diverticulum (2015). Also  has a past surgical history that includes Foot surgery; Knee surgery; Colonoscopy (10/05/12); Cardiac catheterization; and Dilatation & curettage/hysteroscopy with myosure (N/A, 09/27/2015).   Objective:   Vitals: BP (!) 166/88 (BP Location: Left Arm, Cuff Size: Large)   Pulse 78   Temp 97.9 F (36.6 C) (Oral)   Resp 18   Ht 5' 8.9" (1.75 m)   Wt 299 lb 9.6 oz (135.9 kg)   SpO2 100%   BMI 44.37 kg/m   Physical Exam  Constitutional: She is oriented to person, place, and time. She appears well-developed and well-nourished.  HENT:  Head: Normocephalic and atraumatic.  Eyes: Conjunctivae are normal.  Neck: Normal range of motion.  Cardiovascular: Normal rate, regular rhythm, normal heart sounds and intact distal pulses.  Pulmonary/Chest: Effort normal and breath sounds normal. No respiratory distress. She has no wheezes. She has no rhonchi. She has no rales.  Musculoskeletal:       Right lower leg: She exhibits no swelling.       Left lower leg: She exhibits no swelling.  Neurological: She is alert and oriented to person, place, and time.  Skin:  Skin is warm and dry.  Psychiatric: She has a normal mood and affect.  Vitals reviewed.   No results found for this or any previous visit (from the past 24 hour(s)).  BP Readings from Last 3 Encounters:  12/30/17 (!) 166/88  12/13/17 (!) 186/99  10/05/17 (!) 150/100    Assessment and Plan :  1. Elevated serum  creatinine - CMP14+EGFR 2. Elevated liver enzymes Labs pending. - CMP14+EGFR 3. Cough Patient is well-appearing, no distress.  Symptoms are improving.  Lungs are CTAB.  Recommend she continue doxycycline until it is complete.  Given refills for systematic treatment of cough.  Follow-up as needed. - Guaifenesin (MUCINEX MAXIMUM STRENGTH) 1200 MG TB12; Take 1 tablet (1,200 mg total) by mouth every 12 (twelve) hours as needed.  Dispense: 20 tablet; Refill: 0 - benzonatate (TESSALON) 100 MG capsule; Take 1-2 capsules (100-200 mg total) by mouth 3 (three) times daily as needed for cough.  Dispense: 40 capsule; Refill: 0  4. Essential hypertension Still uncontrolled in office.  She is asymptomatic.  We will add amlodipine 5 mg to her current BP regimen of lisinopril 40 mg daily.  Strongly encouraged her to check her BP outside of the office and document these values.  Goal is <140/90.  Plan to follow-up with PCP for further evaluation.  Follow-up here as needed. - amLODipine (NORVASC) 5 MG tablet; Take 1 tablet (5 mg total) by mouth daily.  Dispense: 90 tablet; Refill: 1  Brittany Wiseman, PA-C  [facility] 12/30/2017 3:00 PM   

## 2017-12-30 NOTE — Patient Instructions (Addendum)
In terms of elevated blood pressure, I would like to add on a second medication to your blood pressure regimen.  Please start taking amlodipine 5 mg along with your lisinopril daily.  I would like you to check your blood pressure at least a couple times over the next week outside of the office and document these values. It is best if you check the blood pressure at different times in the day. Your goal is <140/90. If your values are consistently above this goal, please see your PCP for further evaluation. If you start to have chest pain, blurred vision, shortness of breath, severe headache, lower leg swelling, or nausea/vomiting please seek care immediately here or at the ED.   In terms of your blood work, we collected her labs today and should have those results in 4-5 days.  We will contact you with these results.  In terms of your cough, I am glad that you are improving.  I recommend you complete doxycycline.  I have also given you refills for Mucinex and Tessalon Perles to use.  I recommend using Delsym at night for cough.    Please make sure you schedule an appointment with your PCP within the next few weeks.  Thank you for letting me participate in your health and well being.   IF you received an x-ray today, you will receive an invoice from [facility]. Please contact [facility] at [phone] with questions or concerns regarding your invoice.   IF you received labwork today, you will receive an invoice from [facility]. Please contact [facility] at [phone] with questions or concerns regarding your invoice.   Our billing staff will not be able to assist you with questions regarding bills from these companies.  You will be contacted with the lab results as soon as they are available. The fastest way to get your results is to activate your My Chart account. Instructions are located on the last page of this paperwork. If you have not heard from us regarding the results in 2  weeks, please contact this office.

## 2017-12-31 LAB — CMP14+EGFR
A/G RATIO: 1.2 (ref 1.2–2.2)
ALBUMIN: 3.7 g/dL (ref 3.6–4.8)
ALK PHOS: 110 IU/L (ref 39–117)
ALT: 26 IU/L (ref 0–32)
AST: 24 IU/L (ref 0–40)
BILIRUBIN TOTAL: 0.2 mg/dL (ref 0.0–1.2)
BUN / CREAT RATIO: 12 (ref 12–28)
BUN: 10 mg/dL (ref 8–27)
CHLORIDE: 99 mmol/L (ref 96–106)
CO2: 24 mmol/L (ref 20–29)
Calcium: 8.9 mg/dL (ref 8.7–10.3)
Creatinine, Ser: 0.85 mg/dL (ref 0.57–1.00)
GFR calc non Af Amer: 75 mL/min/{1.73_m2} (ref >59)
GFR, EST AFRICAN AMERICAN: 86 mL/min/{1.73_m2} (ref >59)
Globulin, Total: 3 g/dL (ref 1.5–4.5)
Glucose: 282 mg/dL — ABNORMAL HIGH (ref 65–99)
POTASSIUM: 4.7 mmol/L (ref 3.5–5.2)
Sodium: 139 mmol/L (ref 134–144)
TOTAL PROTEIN: 6.7 g/dL (ref 6.0–8.5)

## 2018-01-21 DIAGNOSIS — J399 Disease of upper respiratory tract, unspecified: Secondary | ICD-10-CM | POA: Diagnosis not present

## 2018-01-21 DIAGNOSIS — R05 Cough: Secondary | ICD-10-CM | POA: Diagnosis not present

## 2018-01-21 DIAGNOSIS — J329 Chronic sinusitis, unspecified: Secondary | ICD-10-CM | POA: Diagnosis not present

## 2018-05-11 DIAGNOSIS — N361 Urethral diverticulum: Secondary | ICD-10-CM | POA: Diagnosis not present

## 2018-06-15 ENCOUNTER — Encounter: Payer: 59 | Admitting: Gynecology

## 2018-06-30 ENCOUNTER — Ambulatory Visit (INDEPENDENT_AMBULATORY_CARE_PROVIDER_SITE_OTHER): Payer: 59 | Admitting: Gynecology

## 2018-06-30 ENCOUNTER — Encounter: Payer: Self-pay | Admitting: Gynecology

## 2018-06-30 VITALS — BP 150/90 | Ht 68.5 in | Wt 299.0 lb

## 2018-06-30 DIAGNOSIS — N368 Other specified disorders of urethra: Secondary | ICD-10-CM | POA: Diagnosis not present

## 2018-06-30 DIAGNOSIS — N952 Postmenopausal atrophic vaginitis: Secondary | ICD-10-CM

## 2018-06-30 DIAGNOSIS — Z01411 Encounter for gynecological examination (general) (routine) with abnormal findings: Secondary | ICD-10-CM

## 2018-06-30 NOTE — Progress Notes (Signed)
    Cheryl BalesSharon A Darnell 03/28/1956 [id number]        62 y.o.  G0P0 for annual gynecologic exam.  Has not been in the office for almost 3 years.  Past medical history,surgical history, problem list, medications, allergies, family history and social history were all reviewed and documented as reviewed in the EPIC chart.  ROS:  Performed with pertinent positives and negatives included in the history, assessment and plan.   Additional significant findings : None   Exam: Kennon PortelaKim Gardner assistant Vitals:   06/30/18 1117  BP: (!) 150/90  Weight: 299 lb (135.6 kg)  Height: 5' 8.5" (1.74 m)   Body mass index is 44.8 kg/m.  General appearance:  Normal affect, orientation and appearance. Skin: Grossly normal HEENT: Without gross lesions.  No cervical or supraclavicular adenopathy. Thyroid normal.  Lungs:  Clear without wheezing, rales or rhonchi Cardiac: RR, without RMG Abdominal:  Soft, nontender, without masses, guarding, rebound, organomegaly or hernia Breasts:  Examined lying and sitting without masses, retractions, discharge or axillary adenopathy. Pelvic:  Ext, BUS, Vagina: With atrophic changes.  Urethral diverticulum noted anterior vaginal wall.  Cervix: With atrophic changes.  Pap smear done  Uterus: Unable to palpate but no gross masses or tenderness.  Adnexa: Without gross masses or tenderness    Anus and perineum: Normal   Rectovaginal: Normal sphincter tone without palpated masses or tenderness.    Assessment/Plan:  62 y.o. G0P0 female for annual gynecologic exam.   1. Postmenopausal/atrophic genital changes.  No significant menopausal symptoms or any vaginal bleeding.  Need to report any vaginal bleeding reviewed. 2. Urethral diverticulum.  Patient has been followed by urology.  They had discussed surgery but she is nervous about this.  Reports having seen them last month.  She will continue to follow-up with them in reference to this. 3. Mammography 2014.  Reminded patient she  is overdue and recommended screening mammogram.  Patient agrees to call and schedule.  Breast exam normal today. 4. Pap smear 2016.  Pap smear done today.  No history of significant abnormal Pap smears. 5. Colonoscopy 2011.  Repeat at their recommended interval. 6. DEXA never.  Patient planning to arrange at her primary physician's office. 7. Health maintenance.  No routine lab work done as patient does this elsewhere.  Blood pressure 150/90.  She is actively being seen for this.  Follow-up in 1 year, sooner as needed.   Dara Lordsimothy P Necola Bluestein MD, 11:58 AM 06/30/2018

## 2018-06-30 NOTE — Addendum Note (Signed)
Addended by: Dayna BarkerGARDNER, Mckensey Berghuis K on: 06/30/2018 12:13 PM   Modules accepted: Orders

## 2018-06-30 NOTE — Patient Instructions (Addendum)
Follow-up with urology in reference to the urethral cyst.  Schedule your mammogram  Follow-up in 1 year for annual exam.

## 2018-07-01 LAB — PAP IG W/ RFLX HPV ASCU

## 2019-09-05 ENCOUNTER — Encounter: Payer: Self-pay | Admitting: Gynecology

## 2019-10-26 ENCOUNTER — Other Ambulatory Visit: Payer: Self-pay

## 2019-10-27 ENCOUNTER — Encounter: Payer: Self-pay | Admitting: Gynecology

## 2019-10-27 ENCOUNTER — Ambulatory Visit (INDEPENDENT_AMBULATORY_CARE_PROVIDER_SITE_OTHER): Payer: 59 | Admitting: Gynecology

## 2019-10-27 VITALS — BP 124/84 | Ht 68.0 in | Wt 284.0 lb

## 2019-10-27 DIAGNOSIS — N39498 Other specified urinary incontinence: Secondary | ICD-10-CM

## 2019-10-27 DIAGNOSIS — Z01411 Encounter for gynecological examination (general) (routine) with abnormal findings: Secondary | ICD-10-CM

## 2019-10-27 NOTE — Progress Notes (Signed)
    LUDDIE BOGHOSIAN [birth year] [id number]        63 y.o.  G0P0 for annual gynecologic exam.  Notes some vulvar irritation from wearing pads for urinary incontinence.  Notes that she seems to have a dribble on and off of urine unprovoked.  No real urgency symptoms.  No discomfort.  Past medical history,surgical history, problem list, medications, allergies, family history and social history were all reviewed and documented as reviewed in the EPIC chart.  ROS:  Performed with pertinent positives and negatives included in the history, assessment and plan.   Additional significant findings : None   Exam: Caryn Bee assistant Vitals:   10/27/19 1130  BP: 124/84  Weight: 284 lb (128.8 kg)  Height: 5\' 8"  (1.727 m)   Body mass index is 43.18 kg/m.  General appearance:  Normal affect, orientation and appearance. Skin: Grossly normal HEENT: Without gross lesions.  No cervical or supraclavicular adenopathy. Thyroid normal.  Lungs:  Clear without wheezing, rales or rhonchi Cardiac: RR, without RMG Abdominal:  Soft, nontender, without masses, guarding, rebound, organomegaly or hernia Breasts:  Examined lying and sitting without masses, retractions, discharge or axillary adenopathy. Pelvic:  Ext, BUS, Vagina: With atrophic changes  Cervix: With atrophic changes  Uterus: Unable to palpate but no gross masses or tenderness  Adnexa: Without masses or tenderness    Anus and perineum: Normal   Rectovaginal: Normal sphincter tone without palpated masses or tenderness.    Assessment/Plan:  63 y.o. G0P0 female for annual gynecologic exam.   1. Postmenopausal.  No significant menopausal symptoms or any vaginal bleeding. 2. Urinary incontinence.  Dribbles on and off throughout the day.  No urgency or stress associated incontinence.  Wears a pad daily.  Recommend hydrocortisone cream and Desenex type cream to protect the vulva against the urine.  Offered urology referral but she declines at this  time.  Will call back if she changes her mind.  Check urine analysis today. 3. Mammography 2014.  I again reminded patient she is overdue.  Most common cancer in women.  Patient agrees to call and schedule.  Breast exam normal today. 4. Pap smear 2019.  No Pap smear done today.  No history of significant abnormal Pap smears.  Plan repeat Pap smear at 3-year interval per current screening guidelines. 5. DEXA never.  Patient plans to arrange at her primary provider's office.  Offered option to schedule here 6. Colonoscopy 2011.  Reminded patient she is due she is going to call and arrange. 7. Health maintenance.  No routine lab work done as patient does this elsewhere.  Follow-up 1 year, sooner as needed.   Anastasio Auerbach MD, 11:55 AM 10/27/2019

## 2019-10-27 NOTE — Patient Instructions (Signed)
Schedule your mammogram.  It appears that your colonoscopy is coming due next year and I would call the gastroenterologist to make that appointment.  Call if you would like a referral to the urologists.  Follow up in 1 year for annual exam.

## 2019-10-30 ENCOUNTER — Other Ambulatory Visit: Payer: Self-pay

## 2019-10-30 LAB — URINALYSIS, COMPLETE W/RFL CULTURE
Bilirubin Urine: NEGATIVE
Hgb urine dipstick: NEGATIVE
Hyaline Cast: NONE SEEN /LPF
Nitrites, Initial: NEGATIVE
Specific Gravity, Urine: 1.03 (ref 1.001–1.03)
pH: 6 (ref 5.0–8.0)

## 2019-10-30 LAB — URINE CULTURE
MICRO NUMBER:: 1127766
SPECIMEN QUALITY:: ADEQUATE

## 2019-10-30 LAB — CULTURE INDICATED

## 2019-10-30 MED ORDER — SULFAMETHOXAZOLE-TRIMETHOPRIM 800-160 MG PO TABS: 1.0000 | ORAL_TABLET | Freq: Two times a day (BID) | ORAL | 0 refills | Status: DC

## 2019-11-15 ENCOUNTER — Other Ambulatory Visit: Payer: Self-pay | Admitting: Internal Medicine

## 2019-11-15 DIAGNOSIS — Z1231 Encounter for screening mammogram for malignant neoplasm of breast: Secondary | ICD-10-CM

## 2020-02-07 ENCOUNTER — Other Ambulatory Visit: Payer: Self-pay | Admitting: Urology

## 2020-03-01 ENCOUNTER — Other Ambulatory Visit: Payer: Self-pay

## 2020-03-01 ENCOUNTER — Encounter (HOSPITAL_BASED_OUTPATIENT_CLINIC_OR_DEPARTMENT_OTHER): Payer: Self-pay | Admitting: Urology

## 2020-03-01 NOTE — Progress Notes (Signed)
Spoke w/ via phone for pre-op interview---patient Lab needs dos---- I stat 8, ekg             COVID test ------03-04-2020 at 250 pm Arrive at ------530 am 03-07-2020 NPO after ------midnight Medications to take morning of surgery -----none Diabetic medication -----none day of surgery Patient Special Instructions -----none Pre-Op special Istructions -----none Patient verbalized understanding of instructions that were given at this phone interview. Patient denies shortness of breath, chest pain, fever, cough a this phone interview.

## 2020-03-04 ENCOUNTER — Other Ambulatory Visit (HOSPITAL_COMMUNITY)
Admission: RE | Admit: 2020-03-04 | Discharge: 2020-03-04 | Disposition: A | Discharge status: Home / Self Care | Payer: 59 | Source: Ambulatory Visit | Attending: Urology | Admitting: Urology

## 2020-03-04 DIAGNOSIS — Z20822 Contact with and (suspected) exposure to covid-19: Secondary | ICD-10-CM | POA: Diagnosis not present

## 2020-03-04 DIAGNOSIS — Z01812 Encounter for preprocedural laboratory examination: Secondary | ICD-10-CM | POA: Diagnosis present

## 2020-03-04 LAB — SARS CORONAVIRUS 2 (TAT 6-24 HRS): SARS Coronavirus 2: NEGATIVE

## 2020-03-06 NOTE — Anesthesia Preprocedure Evaluation (Addendum)
Anesthesia Evaluation  Patient identified by MRN, date of birth, ID band Patient awake    Reviewed: Allergy & Precautions, NPO status , Patient's Chart, lab work & pertinent test results  Airway Mallampati: III  TM Distance: >3 FB Neck ROM: Full    Dental  (+) Teeth Intact, Dental Advisory Given   Pulmonary sleep apnea ,    Pulmonary exam normal breath sounds clear to auscultation       Cardiovascular hypertension, Pt. on medications + CAD  Normal cardiovascular exam Rhythm:Regular Rate:Normal     Neuro/Psych PSYCHIATRIC DISORDERS Depression negative neurological ROS     GI/Hepatic negative GI ROS, Neg liver ROS,   Endo/Other  diabetes, Poorly Controlled, Type 2, Oral Hypoglycemic Agents, Insulin DependentMorbid obesity  Renal/GU negative Renal ROS     Musculoskeletal  (+) Fibromyalgia -  Abdominal   Peds  Hematology negative hematology ROS (+)   Anesthesia Other Findings Day of surgery medications reviewed with the patient.  Reproductive/Obstetrics                            Anesthesia Physical Anesthesia Plan  ASA: III  Anesthesia Plan: General   Post-op Pain Management:    Induction: Intravenous  PONV Risk Score and Plan: 4 or greater and Diphenhydramine, Scopolamine patch - Pre-op, Midazolam, Dexamethasone and Ondansetron  Airway Management Planned: LMA  Additional Equipment:   Intra-op Plan:   Post-operative Plan: Extubation in OR  Informed Consent: I have reviewed the patients History and Physical, chart, labs and discussed the procedure including the risks, benefits and alternatives for the proposed anesthesia with the patient or authorized representative who has indicated his/her understanding and acceptance.     Dental advisory given  Plan Discussed with: CRNA  Anesthesia Plan Comments:        Anesthesia Quick Evaluation

## 2020-03-07 ENCOUNTER — Encounter (HOSPITAL_BASED_OUTPATIENT_CLINIC_OR_DEPARTMENT_OTHER): Payer: Self-pay | Admitting: Urology

## 2020-03-07 ENCOUNTER — Encounter (HOSPITAL_BASED_OUTPATIENT_CLINIC_OR_DEPARTMENT_OTHER)
Admission: RE | Disposition: A | Discharge status: Home / Self Care | Payer: Self-pay | Source: Home / Self Care | Attending: Urology

## 2020-03-07 ENCOUNTER — Ambulatory Visit (HOSPITAL_BASED_OUTPATIENT_CLINIC_OR_DEPARTMENT_OTHER): Payer: 59 | Admitting: Certified Registered Nurse Anesthetist

## 2020-03-07 ENCOUNTER — Other Ambulatory Visit: Payer: Self-pay

## 2020-03-07 ENCOUNTER — Ambulatory Visit (HOSPITAL_BASED_OUTPATIENT_CLINIC_OR_DEPARTMENT_OTHER)
Admission: RE | Admit: 2020-03-07 | Discharge: 2020-03-07 | Disposition: A | Discharge status: Home / Self Care | Payer: 59 | Attending: Urology | Admitting: Urology

## 2020-03-07 DIAGNOSIS — Z794 Long term (current) use of insulin: Secondary | ICD-10-CM | POA: Diagnosis not present

## 2020-03-07 DIAGNOSIS — E118 Type 2 diabetes mellitus with unspecified complications: Secondary | ICD-10-CM | POA: Insufficient documentation

## 2020-03-07 DIAGNOSIS — F329 Major depressive disorder, single episode, unspecified: Secondary | ICD-10-CM | POA: Insufficient documentation

## 2020-03-07 DIAGNOSIS — N361 Urethral diverticulum: Secondary | ICD-10-CM | POA: Diagnosis present

## 2020-03-07 DIAGNOSIS — Z6841 Body Mass Index (BMI) 40.0 and over, adult: Secondary | ICD-10-CM | POA: Diagnosis not present

## 2020-03-07 DIAGNOSIS — Z881 Allergy status to other antibiotic agents status: Secondary | ICD-10-CM | POA: Insufficient documentation

## 2020-03-07 DIAGNOSIS — Z833 Family history of diabetes mellitus: Secondary | ICD-10-CM | POA: Insufficient documentation

## 2020-03-07 DIAGNOSIS — Z8042 Family history of malignant neoplasm of prostate: Secondary | ICD-10-CM | POA: Insufficient documentation

## 2020-03-07 DIAGNOSIS — G4730 Sleep apnea, unspecified: Secondary | ICD-10-CM | POA: Insufficient documentation

## 2020-03-07 DIAGNOSIS — M1990 Unspecified osteoarthritis, unspecified site: Secondary | ICD-10-CM | POA: Insufficient documentation

## 2020-03-07 DIAGNOSIS — Z91010 Allergy to peanuts: Secondary | ICD-10-CM | POA: Insufficient documentation

## 2020-03-07 DIAGNOSIS — I2510 Atherosclerotic heart disease of native coronary artery without angina pectoris: Secondary | ICD-10-CM | POA: Diagnosis not present

## 2020-03-07 DIAGNOSIS — J45909 Unspecified asthma, uncomplicated: Secondary | ICD-10-CM | POA: Insufficient documentation

## 2020-03-07 DIAGNOSIS — Z79899 Other long term (current) drug therapy: Secondary | ICD-10-CM | POA: Diagnosis not present

## 2020-03-07 DIAGNOSIS — Z888 Allergy status to other drugs, medicaments and biological substances status: Secondary | ICD-10-CM | POA: Diagnosis not present

## 2020-03-07 DIAGNOSIS — I159 Secondary hypertension, unspecified: Secondary | ICD-10-CM | POA: Insufficient documentation

## 2020-03-07 DIAGNOSIS — Z87440 Personal history of urinary (tract) infections: Secondary | ICD-10-CM | POA: Diagnosis not present

## 2020-03-07 DIAGNOSIS — M797 Fibromyalgia: Secondary | ICD-10-CM | POA: Diagnosis not present

## 2020-03-07 HISTORY — PX: URETHRAL DIVERTICULUM REPAIR: SHX5148

## 2020-03-07 HISTORY — DX: Sleep apnea, unspecified: G47.30

## 2020-03-07 HISTORY — PX: CYSTOSCOPY: SHX5120

## 2020-03-07 LAB — POCT I-STAT, CHEM 8
BUN: 11 mg/dL (ref 8–23)
Calcium, Ion: 1.23 mmol/L (ref 1.15–1.40)
Chloride: 99 mmol/L (ref 98–111)
Creatinine, Ser: 0.8 mg/dL (ref 0.44–1.00)
Glucose, Bld: 333 mg/dL — ABNORMAL HIGH (ref 70–99)
HCT: 45 % (ref 36.0–46.0)
Hemoglobin: 15.3 g/dL — ABNORMAL HIGH (ref 12.0–15.0)
Potassium: 4 mmol/L (ref 3.5–5.1)
Sodium: 136 mmol/L (ref 135–145)
TCO2: 27 mmol/L (ref 22–32)

## 2020-03-07 LAB — GLUCOSE, CAPILLARY
Glucose-Capillary: 277 mg/dL — ABNORMAL HIGH (ref 70–99)
Glucose-Capillary: 304 mg/dL — ABNORMAL HIGH (ref 70–99)
Glucose-Capillary: 317 mg/dL — ABNORMAL HIGH (ref 70–99)
Glucose-Capillary: 277 mg/dL — ABNORMAL HIGH (ref 70–99)

## 2020-03-07 SURGERY — URETHROPLASTY, FOR DIVERTICULUM
Anesthesia: General

## 2020-03-07 MED ORDER — OXYCODONE HCL 5 MG PO TABS
5.0000 mg | ORAL_TABLET | Freq: Once | ORAL | Status: AC
  Administered 2020-03-07: 5 mg via ORAL
  Filled 2020-03-07: qty 1

## 2020-03-07 MED ORDER — OXYCODONE HCL 5 MG PO TABS
ORAL_TABLET | ORAL | Status: AC
  Filled 2020-03-07: qty 1

## 2020-03-07 MED ORDER — METHYLENE BLUE 0.5 % INJ SOLN
INTRAVENOUS | Status: DC | PRN
  Administered 2020-03-07: 5 mL

## 2020-03-07 MED ORDER — SULFAMETHOXAZOLE-TRIMETHOPRIM 800-160 MG PO TABS: 1.0000 | ORAL_TABLET | Freq: Two times a day (BID) | ORAL | 0 refills | Status: DC

## 2020-03-07 MED ORDER — DIPHENHYDRAMINE HCL 50 MG/ML IJ SOLN
INTRAMUSCULAR | Status: AC
  Filled 2020-03-07: qty 1

## 2020-03-07 MED ORDER — FENTANYL CITRATE (PF) 100 MCG/2ML IJ SOLN
25.0000 ug | INTRAMUSCULAR | Status: DC | PRN
  Filled 2020-03-07: qty 1

## 2020-03-07 MED ORDER — LIDOCAINE 2% (20 MG/ML) 5 ML SYRINGE
INTRAMUSCULAR | Status: DC | PRN
  Administered 2020-03-07: 100 mg via INTRAVENOUS

## 2020-03-07 MED ORDER — DIPHENHYDRAMINE HCL 50 MG/ML IJ SOLN
INTRAMUSCULAR | Status: DC | PRN
  Administered 2020-03-07: 12.5 mg via INTRAVENOUS

## 2020-03-07 MED ORDER — ACETAMINOPHEN 500 MG PO TABS
ORAL_TABLET | ORAL | Status: AC
  Filled 2020-03-07: qty 2

## 2020-03-07 MED ORDER — SODIUM CHLORIDE (PF) 0.9 % IJ SOLN
INTRAMUSCULAR | Status: DC | PRN
  Administered 2020-03-07: 60 mL

## 2020-03-07 MED ORDER — GENTAMICIN SULFATE 40 MG/ML IJ SOLN
5.0000 mg/kg | INTRAVENOUS | Status: DC
  Filled 2020-03-07: qty 16

## 2020-03-07 MED ORDER — INSULIN ASPART 100 UNIT/ML ~~LOC~~ SOLN
SUBCUTANEOUS | Status: DC | PRN
  Administered 2020-03-07: 10 [IU] via SUBCUTANEOUS

## 2020-03-07 MED ORDER — ACETAMINOPHEN 500 MG PO TABS
1000.0000 mg | ORAL_TABLET | Freq: Once | ORAL | Status: AC
  Administered 2020-03-07: 1000 mg via ORAL
  Filled 2020-03-07: qty 2

## 2020-03-07 MED ORDER — INSULIN ASPART 100 UNIT/ML ~~LOC~~ SOLN
10.0000 [IU] | Freq: Once | SUBCUTANEOUS | Status: AC
  Administered 2020-03-07: 10 [IU] via INTRAVENOUS
  Filled 2020-03-07: qty 0.1

## 2020-03-07 MED ORDER — PROPOFOL 10 MG/ML IV BOLUS
INTRAVENOUS | Status: DC | PRN
  Administered 2020-03-07: 170 mg via INTRAVENOUS

## 2020-03-07 MED ORDER — SCOPOLAMINE 1 MG/3DAYS TD PT72
MEDICATED_PATCH | TRANSDERMAL | Status: AC
  Filled 2020-03-07: qty 1

## 2020-03-07 MED ORDER — LACTATED RINGERS IV SOLN
INTRAVENOUS | Status: DC
  Administered 2020-03-07: 1000 mL via INTRAVENOUS
  Filled 2020-03-07: qty 1000

## 2020-03-07 MED ORDER — SCOPOLAMINE 1 MG/3DAYS TD PT72
1.0000 | MEDICATED_PATCH | Freq: Once | TRANSDERMAL | Status: DC
  Administered 2020-03-07: 1.5 mg via TRANSDERMAL
  Filled 2020-03-07: qty 1

## 2020-03-07 MED ORDER — MIDAZOLAM HCL 2 MG/2ML IJ SOLN
INTRAMUSCULAR | Status: AC
  Filled 2020-03-07: qty 2

## 2020-03-07 MED ORDER — ONDANSETRON HCL 4 MG/2ML IJ SOLN
INTRAMUSCULAR | Status: AC
  Filled 2020-03-07: qty 2

## 2020-03-07 MED ORDER — FENTANYL CITRATE (PF) 100 MCG/2ML IJ SOLN
INTRAMUSCULAR | Status: AC
  Filled 2020-03-07: qty 2

## 2020-03-07 MED ORDER — DEXAMETHASONE SODIUM PHOSPHATE 10 MG/ML IJ SOLN
INTRAMUSCULAR | Status: AC
  Filled 2020-03-07: qty 1

## 2020-03-07 MED ORDER — PROPOFOL 10 MG/ML IV BOLUS
INTRAVENOUS | Status: AC
  Filled 2020-03-07: qty 20

## 2020-03-07 MED ORDER — INSULIN ASPART 100 UNIT/ML ~~LOC~~ SOLN
SUBCUTANEOUS | Status: AC
  Filled 2020-03-07: qty 1

## 2020-03-07 MED ORDER — LIDOCAINE 2% (20 MG/ML) 5 ML SYRINGE
INTRAMUSCULAR | Status: AC
  Filled 2020-03-07: qty 5

## 2020-03-07 MED ORDER — CEFAZOLIN SODIUM-DEXTROSE 2-4 GM/100ML-% IV SOLN
INTRAVENOUS | Status: AC
  Filled 2020-03-07: qty 100

## 2020-03-07 MED ORDER — TRAMADOL HCL 50 MG PO TABS: 50.0000 mg | ORAL_TABLET | Freq: Four times a day (QID) | ORAL | 0 refills | Status: DC | PRN

## 2020-03-07 MED ORDER — MIDAZOLAM HCL 5 MG/5ML IJ SOLN
INTRAMUSCULAR | Status: DC | PRN
  Administered 2020-03-07: 2 mg via INTRAVENOUS

## 2020-03-07 MED ORDER — CEFAZOLIN SODIUM-DEXTROSE 2-4 GM/100ML-% IV SOLN
2.0000 g | INTRAVENOUS | Status: AC
  Administered 2020-03-07: 2 g via INTRAVENOUS
  Filled 2020-03-07: qty 100

## 2020-03-07 MED ORDER — FENTANYL CITRATE (PF) 100 MCG/2ML IJ SOLN
INTRAMUSCULAR | Status: DC | PRN
  Administered 2020-03-07: 50 ug via INTRAVENOUS
  Administered 2020-03-07: 25 ug via INTRAVENOUS
  Administered 2020-03-07 (×2): 50 ug via INTRAVENOUS
  Administered 2020-03-07: 25 ug via INTRAVENOUS
  Administered 2020-03-07 (×2): 50 ug via INTRAVENOUS

## 2020-03-07 MED ORDER — LIDOCAINE-EPINEPHRINE (PF) 1 %-1:200000 IJ SOLN
INTRAMUSCULAR | Status: DC | PRN
  Administered 2020-03-07: 3 mL

## 2020-03-07 MED ORDER — FENTANYL CITRATE (PF) 250 MCG/5ML IJ SOLN
INTRAMUSCULAR | Status: AC
  Filled 2020-03-07: qty 5

## 2020-03-07 MED ORDER — CLINDAMYCIN PHOSPHATE 2 % VA CREA
1.0000 | TOPICAL_CREAM | Freq: Once | VAGINAL | Status: DC
  Filled 2020-03-07 (×2): qty 40

## 2020-03-07 MED ORDER — GENTAMICIN SULFATE 40 MG/ML IJ SOLN
460.0000 mg | Freq: Once | INTRAVENOUS | Status: AC
  Administered 2020-03-07: 460 mg via INTRAVENOUS
  Filled 2020-03-07 (×2): qty 11.5

## 2020-03-07 MED ORDER — ONDANSETRON HCL 4 MG/2ML IJ SOLN
INTRAMUSCULAR | Status: DC | PRN
  Administered 2020-03-07: 4 mg via INTRAVENOUS

## 2020-03-07 MED ORDER — ONDANSETRON HCL 4 MG/2ML IJ SOLN
4.0000 mg | Freq: Once | INTRAMUSCULAR | Status: DC | PRN
  Filled 2020-03-07: qty 2

## 2020-03-07 SURGICAL SUPPLY — 48 items
BLADE CLIPPER SENSICLIP SURGIC (BLADE) ×2 IMPLANT
BLADE HEX COATED 2.75 (ELECTRODE) ×2 IMPLANT
BLADE SURG 15 STRL LF DISP TIS (BLADE) IMPLANT
BLADE SURG 15 STRL SS (BLADE) ×4
CANISTER SUCT 1200ML W/VALVE (MISCELLANEOUS) ×4 IMPLANT
CATH FOLEY 2WAY SLVR  5CC 16FR (CATHETERS)
CATH FOLEY 2WAY SLVR 5CC 16FR (CATHETERS) IMPLANT
COVER BACK TABLE 60X90IN (DRAPES) ×4 IMPLANT
DISSECTOR ROUND CHERRY 3/8 STR (MISCELLANEOUS) ×4 IMPLANT
DRAPE HYSTEROSCOPY (DRAPE) ×4 IMPLANT
DRAPE SHEET LG 3/4 BI-LAMINATE (DRAPES) ×4 IMPLANT
ELECT REM PT RETURN 9FT ADLT (ELECTROSURGICAL) ×4
ELECTRODE REM PT RTRN 9FT ADLT (ELECTROSURGICAL) ×2 IMPLANT
GAUZE 4X4 16PLY RFD (DISPOSABLE) ×2 IMPLANT
GLOVE BIO SURGEON STRL SZ7.5 (GLOVE) ×8 IMPLANT
GOWN STRL REUS W/ TWL LRG LVL3 (GOWN DISPOSABLE) ×4 IMPLANT
GOWN STRL REUS W/ TWL XL LVL3 (GOWN DISPOSABLE) ×2 IMPLANT
GOWN STRL REUS W/TWL LRG LVL3 (GOWN DISPOSABLE) ×12
GOWN STRL REUS W/TWL XL LVL3 (GOWN DISPOSABLE) ×4
KIT TURNOVER CYSTO (KITS) ×4 IMPLANT
NDL SPNL 22GX3.5 QUINCKE BK (NEEDLE) IMPLANT
NEEDLE HYPO 22GX1.5 SAFETY (NEEDLE) ×4 IMPLANT
NEEDLE SPNL 22GX3.5 QUINCKE BK (NEEDLE) ×4 IMPLANT
PACK BASIN DAY SURGERY FS (CUSTOM PROCEDURE TRAY) ×4 IMPLANT
PACKING VAGINAL (PACKING) ×2 IMPLANT
PENCIL BUTTON HOLSTER BLD 10FT (ELECTRODE) ×4 IMPLANT
PLUG CATH AND CAP STER (CATHETERS) ×2 IMPLANT
RETRACTOR LONRSTAR 16.6X16.6CM (MISCELLANEOUS) IMPLANT
RETRACTOR STAY HOOK 5MM (MISCELLANEOUS) ×2 IMPLANT
RETRACTOR STER APS 16.6X16.6CM (MISCELLANEOUS) ×4
SHEET LAVH (DRAPES) ×4 IMPLANT
SUCTION FRAZIER HANDLE 10FR (MISCELLANEOUS) ×4
SUCTION TUBE FRAZIER 10FR DISP (MISCELLANEOUS) IMPLANT
SUT MNCRL AB 4-0 PS2 18 (SUTURE) ×4 IMPLANT
SUT SILK 3 0 SH CR/8 (SUTURE) ×2 IMPLANT
SUT VIC AB 2-0 UR5 27 (SUTURE) ×2 IMPLANT
SUT VIC AB 3-0 SH 27 (SUTURE) ×4
SUT VIC AB 3-0 SH 27X BRD (SUTURE) IMPLANT
SUT VICRYL 4-0 PS2 18IN ABS (SUTURE) ×4 IMPLANT
SYR 10ML LL (SYRINGE) ×4 IMPLANT
SYR 50ML LL SCALE MARK (SYRINGE) ×2 IMPLANT
SYR BULB IRRIGATION 50ML (SYRINGE) ×4 IMPLANT
SYR CONTROL 10ML LL (SYRINGE) ×4 IMPLANT
TOWEL OR 17X26 10 PK STRL BLUE (TOWEL DISPOSABLE) ×4 IMPLANT
TRAY DSU PREP LF (CUSTOM PROCEDURE TRAY) ×4 IMPLANT
TUBE CONNECTING 12'X1/4 (SUCTIONS) ×1
TUBE CONNECTING 12X1/4 (SUCTIONS) ×5 IMPLANT
YANKAUER SUCT BULB TIP NO VENT (SUCTIONS) ×4 IMPLANT

## 2020-03-07 NOTE — Anesthesia Procedure Notes (Signed)
Procedure Name: LMA Insertion Date/Time: 03/07/2020 7:40 AM Performed by: Epimenio Sarin, CRNA Pre-anesthesia Checklist: Patient identified, Emergency Drugs available, Suction available, Patient being monitored and Timeout performed Patient Re-evaluated:Patient Re-evaluated prior to induction Oxygen Delivery Method: Circle system utilized Preoxygenation: Pre-oxygenation with 100% oxygen Induction Type: IV induction LMA: LMA with gastric port inserted LMA Size: 4.0 Tube size: 4.0 mm Number of attempts: 1 Dental Injury: Teeth and Oropharynx as per pre-operative assessment

## 2020-03-07 NOTE — H&P (Signed)
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  HPI: Cheryl Mckee is a 64 year-old female established patient who is here for urethral diverticulum.  Her problem was discovered 01/10/2014. She has had it for 2 months. Her symptoms have been worse over the last year. She does not have a history of urinary infections.   She does have burning or discomfort when she urinates. She has not had urethral discharge. Her urethral discharge is yellow. She does not have pain with intercourse. She did not see the blood in her unine.   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 She has no history of recurrent UTIs and was asymptomatic. She reports no voiding symptoms. She said it has recurred and is uncomfortable but is not painful. She notes it is not going away as it had in the past.   Interval: The patient was seen 2 weeks ago complaining of dysuria and worsening incontinence. She has been treated for infections over the past 3 months several times. Her symptoms persisted. Today she is not complaining of any significant dysuria, but is having some leakage. She changes her pad 6 times a day. This is out of the abundance of caution, as she readily admits that often her pads are not wet. She denies any mucus discharge. She has incontinence when she is not aware. She denies any associated stress incontinence or urge incontinence. She does have urinary frequency. She denies any real pain with intercourse. She denies any postvoid dribbling.   01/30/20: Patient here today for further discussion with her incontinence and leakage. She uses up to 6 PPD.     ALLERGIES: Accupril Daypro No Allergies Peanuts Zithromax    MEDICATIONS: Levothyroxine Sodium 75 mcg tablet  Lisinopril 40 mg tablet  Myrbetriq 50 mg tablet, extended release 24 hr  Aleve 220 mg tablet Oral  Ketoconazole 2 % cream  Lipitor 40 mg  tablet Oral  Medroxyprogesterone Acetate 5 mg tablet Oral  Metformin Hcl 500 mg tablet Oral  Novolog Flexpen 100 unit/ml (3 ml) insulin pen Subcutaneous  Nystatin 100,000 unit/gram powder Apply to affected area BID  Omega-3 CAPS Oral  Pravastatin Sodium 80 mg tablet Oral  Trazodone Hcl 50 mg tablet  Tresiba     GU PSH: No GU PSH      PSH Notes: Heart Surgery, Knee Surgery Left, Foot Surgery   NON-GU PSH: Anesth, Catheterize Heart     GU PMH: Dysuria - 01/16/2020 Urethral diverticulum (Worsening), 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 She has elected to proceed conservatively with antibiotic therapy and let me know if she would like to proceed with any further intervention. - 05/11/2018, Urethral diverticulum, - 2015 Other specified noninflammatory disorders of vagina, Vaginal cyst - 2015    NON-GU PMH: Candidiasis of skin and nail - 01/16/2020 Encounter for general adult medical examination without abnormal findings, Encounter for preventive health examination - 2015 Personal history of other diseases of the circulatory system, History of hypertension - 2015 Personal history of other diseases of the respiratory system, History of asthma - 2015 Personal history of other endocrine, nutritional and metabolic disease, History of type 2 diabetes mellitus - 2015, History of hypercholesterolemia, - 2015 Arthritis Asthma Depression Diabetes Type 2 Hypertension Obesity Sleep Apnea    FAMILY HISTORY: Diabetes - Runs In Family Prostate Cancer - Runs In Family   SOCIAL HISTORY: Marital Status: Married  Preferred Language: [place]; Ethnicity: Not Hispanic Or Latino; Race: Black or African American Current Smoking Status: Patient has never smoked.   Tobacco Use Assessment Completed: Used Tobacco in last 30 days? Has never drank.  Drinks 3 caffeinated drinks per day.     Notes: Never a smoker, Caffeine use, Alcohol use   REVIEW OF SYSTEMS:    GU Review Female:    Patient reports leakage of urine. Patient denies frequent urination, hard to postpone urination, burning /pain with urination, get up at night to urinate, stream starts and stops, trouble starting your stream, have to strain to urinate, and being pregnant.  Gastrointestinal (Upper):   Patient denies nausea, indigestion/ heartburn, and vomiting.  Gastrointestinal (Lower):   Patient denies diarrhea and constipation.  Constitutional:   Patient denies fever, night sweats, weight loss, and fatigue.  Skin:   Patient denies skin rash/ lesion and itching.  Eyes:   Patient denies blurred vision and double vision.  Ears/ Nose/ Throat:   Patient denies sore throat and sinus problems.  Hematologic/Lymphatic:   Patient denies swollen glands and easy bruising.  Cardiovascular:   Patient denies leg swelling and chest pains.  Respiratory:   Patient denies cough and shortness of breath.  Endocrine:   Patient denies excessive thirst.  Musculoskeletal:   Patient denies back pain and joint pain.  Neurological:   Patient denies headaches and dizziness.  Psychologic:   Patient denies depression and anxiety.   Notes: 6 ppd    VITAL SIGNS:      01/30/2020 11:13 AM 01/30/2020 10:25 AM  Weight   285 lb / 129.27 kg  Height   69 in / 175.26 cm  BP 210/81 mmHg 203/96 mmHg  Pulse 78 /min 84 /min  Temperature   98.6 F / 37 C  BMI 42.1 kg/m   GU PHYSICAL EXAMINATION:    Vagina: Mild vaginal atrophy, mild introital stenosis, palpable fullness in the anterior vaginal wall, I was unable to appreciate any distinct bulge. When I massaged this area though I did get some gritty exudative material per urethra.   MULTI-SYSTEM PHYSICAL EXAMINATION:       PAST DATA REVIEWED:  Source Of History:  Patient  X-Ray Review: MRI Pelvis: Reviewed Films. Discussed With Patient. 2 cm x 3 cm diverticulum that appears to come off at the 5 o'clock position. Extends up to 2:00 a.m. in down around the to 11:00 a.m. on the posterior  aspect.    PROCEDURES:         Flexible Cystoscopy - 52000  Risks, benefits, and some of the potential complications of the procedure were discussed at length with the patient including infection, bleeding, voiding discomfort, urinary retention, fever, chills, sepsis, and others. All questions were answered. Informed consent was obtained. Antibiotic prophylaxis was given. Sterile technique and intraurethral analgesia were used.  Meatus:  Normal size. Normal location. Normal condition.  Urethra:  No hypermobility. No leakage.  Ureteral Orifices:  Normal location. Normal size. Normal shape. Effluxed clear urine.  Bladder:  No trabeculation. No tumors. Normal mucosa. No stones.      The lower urinary tract was carefully examined. The procedure was well-tolerated and without complications. Antibiotic instructions were given. Instructions were given to call the office immediately for bloody urine, difficulty urinating, urinary retention, painful or frequent urination, fever, chills, nausea, vomiting or other illness. The patient stated that she understood these instructions and would comply with them.         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 This was present on his MRI in February of 2015. She has since become more symptomatic. It may have progressed some. I was able to express some discharge from her urethra today. I could palpate the CIS, but I could not clearly see it because of limitations from her body habitus. I suspect that we would be able to get her in a better position under anesthesia.   We discussed treatment options and I recommended excision of the cyst/diverticulum. This is a fairly extensive diverticulum/cyst and as such will require a lengthy operation. I suspect that it will take [place] around 2-1/2 hours to fully excise this. I went through the operation with her as well as the expected outcomes. I suspect that she will have some less leakage, but I am not sure that all her leakage is from the diverticulum, as some of it may also be associated with routine typical stress incontinence. At this point I do not think it is wise to give the patient a mid urethral sling risking the development of a fistula or sling erosion. If she does need a sling in the future that may be something we can talk about at that point. However, she is ready to have proceed with diverticular excision. Will try to get this scheduled for her in the next 3-4 weeks.

## 2020-03-07 NOTE — Op Note (Signed)
Preoperative diagnosis:  1. Urethral diverticulum  Postoperative diagnosis:  1. Same  Procedure: 1. Urethral diverticulectomy 2. Cystoscopy  Surgeon: Crist Fat, MD  Anesthesia: General  Complications: None  Intraoperative findings:  #1: Cystoscopy demonstrated a diverticular os at the 7 o'clock position in the distal urethra okay. #2: The sac was dissected all the way to the proximal urethra and the 2 o'clock position.  There was a loculation within the diverticulum but the smaller septated area at the posterior urethra anteriorly was also removed.  At this point there was a small urethrotomy and the catheter was visible.  This was closed in 3 separate layers.  EBL: See anesthesia records  Specimens: Urethral diverticulum  Indication: Cheryl Mckee is a 64 y.o. patient with symptomatic urethral diverticula.  She was having persistent urinary incontinence and recurrent urinary tract infections.  After reviewing the management options for treatment, he elected to proceed with the above surgical procedure(s). We have discussed the potential benefits and risks of the procedure, side effects of the proposed treatment, the likelihood of the patient achieving the goals of the procedure, and any potential problems that might occur during the procedure or recuperation. Informed consent has been obtained.  Description of procedure:  The patient was taken to the operating room and general anesthesia was induced.  The patient was placed in the dorsal lithotomy position, prepped and draped in the usual sterile fashion, and preoperative antibiotics were administered. A preoperative time-out was performed.   We started with a 17 French 70 degree cystoscope and perform cystoscopy demonstrating orthotopic ureters and a normal bladder mucosa.  I then pushed up against the diverticulum and found the os emanating at the 7 o'clock position in the distal urethra.  Subsequently removed the  cystoscope and placed a 16 French Foley catheter.  The bladder was subsequently drained and then remove the catheter again inserted 60 cc of methylene blue into the patient's urethra.  The catheter was then replaced and capped.  I then placed a weighted speculum and a [place] retractor retracting the vaginal opening. I injected the anterior vaginal wall w/ 1% lidocaine.   I then performed inverted U incision and dissected down along the periurethral fascia creating a nice vascular vaginal flap.  This point I went through the periurethral fascia and count of the diverticulum.  I opened the diverticulum and then dissected it laterally on both sides and then on the anterior aspect of the around the 2 o'clock position.  Ultimately was able to get around the anterior and posterior aspect of the diverticulum carried the dissection posterior to the 9 o'clock position.  Once the diverticulum was completely free removed at all and sent for pathology.  Reinspection demonstrated a small urethrotomy at the os which we closed with 4-0 Vicryl in interrupted fashion.  We then closed the periurethral fascia over the urethrotomy incision again with 3 oh in an interrupted fashion.  The vaginal flap was then closed with a 2-0 Vicryl in a running locking fashion.  The vagina was then packed with clindamycin impregnated vaginal packing.  The Foley was placed to drainage and the patient was subsequently extubated and returned to the PACU in good condition.   Crist Fat, M.D.

## 2020-03-07 NOTE — Discharge Instructions (Signed)
Post-op Instructions following Vaginal/Urethral surgery  Diet:  You may return to your normal diet within 24 hours following your surgery. You may note some mild nausea and possibly vomiting the first 6-8 hours following surgery. This is usually due to the side effects of anesthesia, and will disappear quite soon. I would suggest clear liquids and a very light meal the first evening following your surgery.  Activity:  Your physical activity is to be restricted, especially during the first 2 weeks home. During this time use the following guidelines:   No lifting heavy objects (anything greater than 10 pounds).  No driving a car and limit long car rides.  No strenuous exercise, limits stairclimbing to a minimum.  Do not swim or soak in a bath tub for 2 weeks.  Do not place anything per vagina for 4 weeks.  Wound care: Marland Kitchen There is very little wound care.  The vaginal incision may ooze/bleed a little bit the first couple days after surgery.  This is normal.  It is okay to use a sanitary pad to help keep things clean.  Otherwise this incision needs no additional care.  Bowels:  You may need a stool softener and. A bowel movement every other day is reasonable. Use a mild laxative if needed, such as milk of magnesia 2-3 tablespoons, or 2 Dulcolax tablets. Call if you continue to have problems. If you had been taking narcotics for pain, before, during or after your surgery, you may be constipated. Take a laxative if necessary.  Medication:  You should resume your pre-surgery medications unless told not to. In addition you may be given an antibiotic to prevent or treat infection. Antibiotics are not always necessary. Pain pills (Tramadol) may also be given to help with the incision and catheter discomfort. Tylenol (acetaminophen) or Advil (ibuprofen) which have no narcotics are better if the pain is not too bad. All medication should be taken as prescribed until the bottles are finished unless  you are having an unusual reaction to one of the drugs.  Problems you should report to [place]:  a. Fever greater than 101F. b. Heavy bleeding, or clots (see notes above about blood in urine). c. Inability to urinate. d. Drug reactions (hives, rash, nausea, vomiting, diarrhea). e. Severe burning or pain with urination that is not improving.   Foley Catheter Care, Adult A soft, flexible tube (Foley catheter) has been placed in your bladder. This may be done to temporarily help with urine drainage after an operation or to relieve blockage from an enlarged prostate gland. HOME CARE INSTRUCTIONS  If you are going home with a Foley catheter in place, follow these instructions: Taking Care of the Catheter:  Keep the area where the catheter leaves your body clean.  Attach the catheter to the leg so there is no tension on the catheter.  Keep the drainage bag below the level of the bladder, but keep it OFF the floor.  Do not take long soaking baths.   Wash your hands before touching ANYTHING related to the catheter or bag.  Using mild soap and warm water on a washcloth:  Clean the area closest to the catheter insertion site using a circular motion around the catheter.  Clean the catheter itself by wiping AWAY from the insertion site for several inches down the tube.  NEVER wipe upward as this could sweep bacteria up into the urethra (tube in your body that normally drains the bladder) and cause infection. Taking Care of the Drainage Bags:  Two drainage bags will be taken home: a large overnight drainage bag, and a smaller leg bag which fits underneath clothing.  It is okay to wear the overnight bag at any time, but NEVER wear the smaller leg bag at night.  Keep the drainage bag well below the level of your bladder. This prevents backflow of urine into the bladder and allows the urine to drain freely.  Anchor the tubing to your leg to prevent pulling or tension on the catheter. Use tape  or a leg strap provided by the hospital.  Empty the drainage bag when it is  to  full. Wash your hands before and after touching the bag.  Periodically check the tubing for kinks to make sure there is no pressure on the tubing which could restrict the flow of urine. Changing the Drainage Bags:  Cleanse both ends of the clean bag with alcohol before changing.  Pinch off the rubber catheter to avoid urine spillage during the disconnection.  Disconnect the dirty bag and connect the clean one.  Empty the dirty bag carefully to avoid a urine spill.  Attach the new bag to the leg with tape or a leg strap. Cleaning the Drainage Bags:  Whenever a drainage bag is disconnected, it must be cleaned quickly so it is ready for the next use.  Wash the bag in warm, soapy water.  Rinse the bag thoroughly with warm water.  Soak the bag for 30 minutes in a solution of white vinegar and water (1 cup vinegar to 1 quart warm water).  Rinse with warm water. SEEK MEDICAL CARE IF:   Some pain develops in the kidney (lower back) area.  The urine is cloudy or smells bad.  There is some blood in the urine.  The catheter becomes clogged and/or there is no urine drainage. SEEK IMMEDIATE MEDICAL CARE IF:   You have moderate or severe pain in the kidney region.  You start to throw up (vomit).  Blood fills the tube.  Worsening belly (abdominal) pain develops.  You have a fever. MAKE SURE YOU:   Understand these instructions.  Will watch your condition.  Will get help right away if you are not doing well or get worse.  Call [facility] if you have any questions or concerns: [phone]    Post [place] Instructions  Activity: Get plenty of rest for the remainder of the day. A responsible individual must stay with you for 24 hours following the procedure.  For the next 24 hours, DO NOT: -Drive a car -Paediatric nurse -Drink alcoholic beverages -Take any medication  unless instructed by your physician -Make any legal decisions or sign important papers.  Meals: Start with liquid foods such as gelatin or soup. Progress to regular foods as tolerated. Avoid greasy, spicy, heavy foods. If nausea and/or vomiting occur, drink only clear liquids until the nausea and/or vomiting subsides. Call your physician if vomiting continues.  Special Instructions/Symptoms: Your throat may feel dry or sore from the anesthesia or the breathing tube placed in your throat during surgery. If this causes discomfort, gargle with warm salt water. The discomfort should disappear within 24 hours.  If you had a scopolamine patch placed behind your ear for the management of post- operative nausea and/or vomiting:  1. The medication in the patch is effective for 72 hours, after which it should be removed.  Wrap patch in a tissue and discard in the trash. Wash hands thoroughly with soap and water. 2. You  may remove the patch earlier than 72 hours if you experience unpleasant side effects which may include dry mouth, dizziness or visual disturbances. 3. Avoid touching the patch. Wash your hands with soap and water after contact with the patch.

## 2020-03-07 NOTE — Anesthesia Postprocedure Evaluation (Signed)
Anesthesia Post Note  Patient: SANIAH SCHROETER  Procedure(s) Performed: TRANSVAGINAL URETHRAL DIVERTICULECTOMY (N/A ) CYSTOSCOPY     Patient location during evaluation: PACU Anesthesia Type: General Level of consciousness: awake and alert Pain management: pain level controlled Vital Signs Assessment: post-procedure vital signs reviewed and stable Respiratory status: spontaneous breathing, nonlabored ventilation, respiratory function stable and patient connected to nasal cannula oxygen Cardiovascular status: blood pressure returned to baseline and stable Postop Assessment: no apparent nausea or vomiting Anesthetic complications: no    Last Vitals:  Vitals:   03/07/20 1145 03/07/20 1300  BP: (!) 174/74 (!) 170/70  Pulse: 71 70  Resp: 19 14  Temp: 36.9 C 36.5 C  SpO2: 95% 99%    Last Pain:  Vitals:   03/07/20 1245  TempSrc:   PainSc: 4                  Cecile Hearing

## 2020-03-07 NOTE — Transfer of Care (Signed)
Immediate Anesthesia Transfer of Care Note  Patient: Cheryl Mckee  Procedure(s) Performed: Procedure(s): TRANSVAGINAL URETHRAL DIVERTICULECTOMY (N/A) CYSTOSCOPY  Patient Location: PACU  Anesthesia Type:General  Level of Consciousness: Alert, Awake, Oriented  Airway & Oxygen Therapy: Patient Spontanous Breathing  Post-op Assessment: Report given to RN  Post vital signs: Reviewed and stable  Last Vitals:  Vitals:   03/07/20 0541 03/07/20 1046  BP: (!) 176/75 (!) 183/69  Pulse: 78 81  Resp: 18 (!) 25  Temp: (!) 36.3 C 36.9 C  SpO2: 100% 100%    Complications: No apparent anesthesia complications

## 2020-03-07 NOTE — Interval H&P Note (Signed)
History and Physical Interval Note:  03/07/2020 7:13 AM  Cheryl Mckee  has presented today for surgery, with the diagnosis of URETHRAL DIVERTICULUM.  The various methods of treatment have been discussed with the patient and family. After consideration of risks, benefits and other options for treatment, the patient has consented to  Procedure(s): TRANSVAGINAL URETHRAL DIVERTICULECTOMY (N/A) as a surgical intervention.  The patient's history has been reviewed, patient examined, no change in status, stable for surgery.  I have reviewed the patient's chart and labs.  Questions were answered to the patient's satisfaction.     Crist Fat

## 2020-03-08 LAB — SURGICAL PATHOLOGY

## 2020-10-30 ENCOUNTER — Encounter: Payer: 59 | Admitting: Obstetrics and Gynecology

## 2020-11-10 ENCOUNTER — Other Ambulatory Visit: Payer: Self-pay

## 2020-11-10 ENCOUNTER — Emergency Department (HOSPITAL_COMMUNITY): Payer: 59

## 2020-11-10 ENCOUNTER — Encounter (HOSPITAL_COMMUNITY): Payer: Self-pay | Admitting: Obstetrics and Gynecology

## 2020-11-10 ENCOUNTER — Observation Stay (HOSPITAL_COMMUNITY)
Admission: EM | Admit: 2020-11-10 | Discharge: 2020-11-12 | Disposition: A | Discharge status: Home / Self Care | Payer: 59 | Attending: Internal Medicine | Admitting: Internal Medicine

## 2020-11-10 DIAGNOSIS — Z79899 Other long term (current) drug therapy: Secondary | ICD-10-CM | POA: Insufficient documentation

## 2020-11-10 DIAGNOSIS — R0789 Other chest pain: Secondary | ICD-10-CM

## 2020-11-10 DIAGNOSIS — I10 Essential (primary) hypertension: Secondary | ICD-10-CM | POA: Diagnosis present

## 2020-11-10 DIAGNOSIS — R0600 Dyspnea, unspecified: Secondary | ICD-10-CM

## 2020-11-10 DIAGNOSIS — Z20822 Contact with and (suspected) exposure to covid-19: Secondary | ICD-10-CM | POA: Diagnosis not present

## 2020-11-10 DIAGNOSIS — Z794 Long term (current) use of insulin: Secondary | ICD-10-CM | POA: Insufficient documentation

## 2020-11-10 DIAGNOSIS — G4733 Obstructive sleep apnea (adult) (pediatric): Secondary | ICD-10-CM | POA: Insufficient documentation

## 2020-11-10 DIAGNOSIS — R0609 Other forms of dyspnea: Principal | ICD-10-CM | POA: Insufficient documentation

## 2020-11-10 DIAGNOSIS — E119 Type 2 diabetes mellitus without complications: Secondary | ICD-10-CM | POA: Insufficient documentation

## 2020-11-10 DIAGNOSIS — E785 Hyperlipidemia, unspecified: Secondary | ICD-10-CM | POA: Insufficient documentation

## 2020-11-10 DIAGNOSIS — I2510 Atherosclerotic heart disease of native coronary artery without angina pectoris: Secondary | ICD-10-CM | POA: Insufficient documentation

## 2020-11-10 DIAGNOSIS — K745 Biliary cirrhosis, unspecified: Secondary | ICD-10-CM | POA: Diagnosis present

## 2020-11-10 DIAGNOSIS — R0602 Shortness of breath: Secondary | ICD-10-CM

## 2020-11-10 DIAGNOSIS — I160 Hypertensive urgency: Secondary | ICD-10-CM

## 2020-11-10 LAB — COMPREHENSIVE METABOLIC PANEL
ALT: 38 U/L (ref 0–44)
AST: 41 U/L (ref 15–41)
Albumin: 3.7 g/dL (ref 3.5–5.0)
Alkaline Phosphatase: 131 U/L — ABNORMAL HIGH (ref 38–126)
Anion gap: 10 (ref 5–15)
BUN: 12 mg/dL (ref 8–23)
CO2: 27 mmol/L (ref 22–32)
Calcium: 9 mg/dL (ref 8.9–10.3)
Chloride: 100 mmol/L (ref 98–111)
Creatinine, Ser: 0.86 mg/dL (ref 0.44–1.00)
GFR, Estimated: >60 mL/min (ref >60)
Glucose, Bld: 202 mg/dL — ABNORMAL HIGH (ref 70–99)
Potassium: 3.8 mmol/L (ref 3.5–5.1)
Sodium: 137 mmol/L (ref 135–145)
Total Bilirubin: 0.6 mg/dL (ref 0.3–1.2)
Total Protein: 7.8 g/dL (ref 6.5–8.1)

## 2020-11-10 LAB — CBC
HCT: 39.8 % (ref 36.0–46.0)
Hemoglobin: 12.5 g/dL (ref 12.0–15.0)
MCH: 26.1 pg (ref 26.0–34.0)
MCHC: 31.4 g/dL (ref 30.0–36.0)
MCV: 83.1 fL (ref 80.0–100.0)
Platelets: 223 10*3/uL (ref 150–400)
RBC: 4.79 MIL/uL (ref 3.87–5.11)
RDW: 14.2 % (ref 11.5–15.5)
WBC: 10.3 10*3/uL (ref 4.0–10.5)
nRBC: 0 % (ref 0.0–0.2)

## 2020-11-10 LAB — LACTIC ACID, PLASMA
Lactic Acid, Venous: 1.3 mmol/L (ref 0.5–1.9)
Lactic Acid, Venous: 1.1 mmol/L (ref 0.5–1.9)

## 2020-11-10 LAB — TROPONIN I (HIGH SENSITIVITY)
Troponin I (High Sensitivity): 49 ng/L — ABNORMAL HIGH (ref <18)
Troponin I (High Sensitivity): 51 ng/L — ABNORMAL HIGH (ref <18)

## 2020-11-10 LAB — RESP PANEL BY RT-PCR (FLU A&B, COVID) ARPGX2
Influenza A by PCR: NEGATIVE
Influenza B by PCR: NEGATIVE
SARS Coronavirus 2 by RT PCR: NEGATIVE

## 2020-11-10 LAB — BRAIN NATRIURETIC PEPTIDE: B Natriuretic Peptide: 99.4 pg/mL (ref 0.0–100.0)

## 2020-11-10 MED ORDER — INSULIN DEGLUDEC 200 UNIT/ML ~~LOC~~ SOPN: 125.0000 [IU] | PEN_INJECTOR | Freq: Every day | SUBCUTANEOUS | Status: DC

## 2020-11-10 MED ORDER — METFORMIN HCL ER 500 MG PO TB24
1000.0000 mg | ORAL_TABLET | Freq: Two times a day (BID) | ORAL | Status: DC
  Administered 2020-11-11: 1000 mg via ORAL
  Filled 2020-11-10 (×3): qty 2

## 2020-11-10 MED ORDER — SEMAGLUTIDE (1 MG/DOSE) 2 MG/1.5ML ~~LOC~~ SOPN: 1.0000 mg | PEN_INJECTOR | SUBCUTANEOUS | Status: DC

## 2020-11-10 MED ORDER — ACETAMINOPHEN 500 MG PO TABS: 1000.0000 mg | ORAL_TABLET | Freq: Four times a day (QID) | ORAL | Status: DC | PRN

## 2020-11-10 MED ORDER — LISINOPRIL 10 MG PO TABS
10.0000 mg | ORAL_TABLET | Freq: Once | ORAL | Status: AC
  Administered 2020-11-10: 10 mg via ORAL
  Filled 2020-11-10: qty 1

## 2020-11-10 MED ORDER — LISINOPRIL 20 MG PO TABS
20.0000 mg | ORAL_TABLET | Freq: Every day | ORAL | Status: DC
  Administered 2020-11-11 – 2020-11-12 (×2): 20 mg via ORAL
  Filled 2020-11-10 (×2): qty 1

## 2020-11-10 MED ORDER — INSULIN ASPART 100 UNIT/ML ~~LOC~~ SOLN
25.0000 [IU] | Freq: Every day | SUBCUTANEOUS | Status: DC
  Administered 2020-11-11: 25 [IU] via SUBCUTANEOUS
  Filled 2020-11-10: qty 0.25

## 2020-11-10 MED ORDER — ASCORBIC ACID 500 MG PO TABS
250.0000 mg | ORAL_TABLET | Freq: Every day | ORAL | Status: DC
  Administered 2020-11-11 – 2020-11-12 (×2): 250 mg via ORAL
  Filled 2020-11-10 (×2): qty 1

## 2020-11-10 MED ORDER — LABETALOL HCL 5 MG/ML IV SOLN
10.0000 mg | Freq: Once | INTRAVENOUS | Status: AC
  Administered 2020-11-10: 10 mg via INTRAVENOUS
  Filled 2020-11-10: qty 4

## 2020-11-10 MED ORDER — TRAZODONE HCL 50 MG PO TABS: 50.0000 mg | ORAL_TABLET | Freq: Every evening | ORAL | Status: DC | PRN

## 2020-11-10 MED ORDER — INSULIN GLARGINE 100 UNIT/ML ~~LOC~~ SOLN
125.0000 [IU] | Freq: Every day | SUBCUTANEOUS | Status: DC
  Administered 2020-11-11: 125 [IU] via SUBCUTANEOUS
  Filled 2020-11-10 (×2): qty 1.25

## 2020-11-10 MED ORDER — NYSTATIN 100000 UNIT/GM EX POWD: 1.0000 "application " | Freq: Every day | CUTANEOUS | Status: DC | PRN

## 2020-11-10 MED ORDER — ATORVASTATIN CALCIUM 40 MG PO TABS
40.0000 mg | ORAL_TABLET | Freq: Every day | ORAL | Status: DC
  Administered 2020-11-11 – 2020-11-12 (×2): 40 mg via ORAL
  Filled 2020-11-10 (×2): qty 1

## 2020-11-10 MED ORDER — TRAMADOL HCL 50 MG PO TABS: 50.0000 mg | ORAL_TABLET | Freq: Four times a day (QID) | ORAL | Status: DC | PRN

## 2020-11-10 MED ORDER — PROSIGHT PO TABS
1.0000 | ORAL_TABLET | Freq: Every day | ORAL | Status: DC
  Administered 2020-11-11: 1 via ORAL
  Filled 2020-11-10 (×2): qty 1

## 2020-11-10 MED ORDER — ADULT MULTIVITAMIN W/MINERALS CH
1.0000 | ORAL_TABLET | Freq: Every day | ORAL | Status: DC
  Administered 2020-11-11 – 2020-11-12 (×2): 1 via ORAL
  Filled 2020-11-10 (×2): qty 1

## 2020-11-10 MED ORDER — INSULIN ASPART 100 UNIT/ML ~~LOC~~ SOLN
13.0000 [IU] | Freq: Every day | SUBCUTANEOUS | Status: DC
  Administered 2020-11-11: 13 [IU] via SUBCUTANEOUS
  Filled 2020-11-10: qty 0.13

## 2020-11-10 MED ORDER — INSULIN ASPART 100 UNIT/ML ~~LOC~~ SOLN
55.0000 [IU] | Freq: Every day | SUBCUTANEOUS | Status: DC
  Filled 2020-11-10: qty 0.55

## 2020-11-10 MED ORDER — INSULIN ASPART 100 UNIT/ML FLEXPEN
13.0000 [IU] | PEN_INJECTOR | SUBCUTANEOUS | Status: DC
Start: 2020-11-10 — End: 2020-11-10

## 2020-11-10 NOTE — ED Triage Notes (Signed)
Patient presents to the ER for chest pain x2 weeks. She reports it is worse when laying down. Patient denies cardiac hx. Denies N/V/D.

## 2020-11-10 NOTE — ED Notes (Signed)
RN aware of continued elevated BP readings. Will continue to monitor.

## 2020-11-10 NOTE — ED Notes (Signed)
MD aware of BP

## 2020-11-10 NOTE — ED Provider Notes (Signed)
[facility]-EMERGENCY DEPT Provider Note   CSN: [id number] Arrival date & time: 11/10/20  1408     History No chief complaint on file.   Cheryl Mckee is a 64 y.o. female.  HPI Patient presents with concern of chest pain, dyspnea. She acknowledges multiple medical issues including hypertension, diabetes.  She has been taking her medication as directed until today, when she was unable to do so. She notes that over the past week she has had a new dyspnea with positioning, and chest tightness. Symptoms are present with supine positioning, improve with sitting upright for several hours.  There is associated new swelling, left lower extremity.  This also improves overnight when she is not standing up. Currently states she states that she feels" fine" denies pain, discomfort, dyspnea. No other new complaints including fever, nausea, vomiting, diarrhea. No recent change in medication, diet, activity. Patient is a non-smoker.    Past Medical History:  Diagnosis Date  . Coronary artery disease   . Diabetes mellitus without complication (HCC)    type 2  . Fibromyalgia   . Hypertension   . Sleep apnea    could not tolerate cpap, sleep study 10 yrs ago  . Urethral diverticulum 2015   evaluated by Dr. Wynelle Link  . Urinary incontinence     Patient Active Problem List   Diagnosis Date Noted  . Exertional dyspnea 11/10/2020  . Morbid obesity (HCC) 06/15/2014  . Obstructive sleep apnea 06/15/2014  . Noncompliance 06/15/2014  . Diabetes mellitus without complication (HCC)   . Depression   . Hypertension   . Cirrhosis, biliary (HCC)   . Fibromyalgia     Past Surgical History:  Procedure Laterality Date  . CARDIAC CATHETERIZATION  2001   nonobstructive cad  . COLONOSCOPY  10/05/12  . CYSTOSCOPY  03/07/2020   Procedure: CYSTOSCOPY;  Surgeon: Crist Fat, MD;  Location: [facility];  Service: Urology;;  . DILATATION & CURETTAGE/HYSTEROSCOPY  WITH MYOSURE N/A 09/27/2015   Procedure: DILATATION & CURETTAGE/HYSTEROSCOPY WITH MYOSURE;  Surgeon: Dara Lords, MD;  Location: [facility];  Service: Gynecology;  Laterality: N/A;  . FOOT SURGERY Left   . KNEE SURGERY Left    meniscus repair  . URETHRAL DIVERTICULUM REPAIR N/A 03/07/2020   Procedure: TRANSVAGINAL URETHRAL DIVERTICULECTOMY;  Surgeon: Crist Fat, MD;  Location: [facility];  Service: Urology;  Laterality: N/A;     OB History    Gravida  0   Para      Term      Preterm      AB      Living        SAB      TAB      Ectopic      Multiple      Live Births              Family History  Problem Relation Age of Onset  . Diabetes Mother   . Hypertension Mother   . Diabetes Father   . Cancer Father        Deceased, 70s--Unknown type cancer  . Cancer Maternal Aunt        Ovarian or Uterine  . Breast cancer Cousin        Maternal 1st cousin-Age 49    Social History   Tobacco Use  . Smoking status: Never Smoker  . Smokeless tobacco: Never Used  Vaping Use  . Vaping Use: Never used  Substance Use  Topics  . Alcohol use: No    Alcohol/week: 0.0 standard drinks  . Drug use: No    Home Medications Prior to Admission medications   Medication Sig Start Date End Date Taking? Authorizing Provider  acetaminophen (TYLENOL) 500 MG tablet Take 1,000 mg by mouth every 6 (six) hours as needed.    [provider]  atorvastatin (LIPITOR) 40 MG tablet Take 40 mg by mouth daily.    [provider]  Insulin Aspart (NOVOLOG [place]) Inject 45 Units into the skin daily.     [provider]  Insulin Degludec (TRESIBA FLEXTOUCH [place]) Inject into the skin. 160 mg in am    [provider]  lisinopril (PRINIVIL,ZESTRIL) 40 MG tablet Take 1 tablet (40 mg total) by mouth daily. 02/28/17   Everlene Farrier, PA-C  metFORMIN (GLUCOPHAGE) 500 MG tablet Take 500 mg by mouth 2 (two) times daily with a meal.    [provider]  sulfamethoxazole-trimethoprim (BACTRIM DS) 800-160 MG tablet Take 1 tablet by mouth 2 (two) times daily. 03/07/20   Crist Fat, MD  traMADol (ULTRAM) 50 MG tablet Take 1-2 tablets (50-100 mg total) by mouth every 6 (six) hours as needed for moderate pain. 03/07/20   Crist Fat, MD  traZODone (DESYREL) 50 MG tablet Take 50 mg by mouth at bedtime as needed for sleep.     [provider]  UNABLE TO FIND Med Name: oxempic 1 mg weekly shot on wednesday    [provider]    Allergies    Peanut-containing drug products, Accupril [quinapril hcl], Daypro [oxaprozin], and Zithromax [azithromycin]  Review of Systems   Review of Systems  Constitutional:       Per HPI, otherwise negative  HENT:       Per HPI, otherwise negative  Respiratory:       Per HPI, otherwise negative  Cardiovascular:       Per HPI, otherwise negative  Gastrointestinal: Negative for vomiting.  Endocrine:       Negative aside from HPI  Genitourinary:       Neg aside from HPI   Musculoskeletal:       Per HPI, otherwise negative  Skin: Negative.   Neurological: Negative for syncope.    Physical Exam Updated Vital Signs BP (!) 211/85 (BP Location: Right Arm)   Pulse 77   Temp 97.9 F (36.6 C) (Oral)   Resp 20   LMP 10/29/2014   SpO2 97%   Physical Exam Vitals and nursing note reviewed.  Constitutional:      General: She is not in acute distress.    Appearance: She is well-developed. She is obese.  HENT:     Head: Normocephalic and atraumatic.  Eyes:     Conjunctiva/sclera: Conjunctivae normal.  Cardiovascular:     Rate and Rhythm: Normal rate and regular rhythm.  Pulmonary:     Effort: Pulmonary effort is normal. No respiratory distress.     Breath sounds: Decreased air movement present. No stridor.  Abdominal:     General: There is no distension.  Musculoskeletal:     Left lower leg: Edema present.  Skin:    General: Skin is warm and dry.    Neurological:     Mental Status: She is alert and oriented to person, place, and time.     Cranial Nerves: No cranial nerve deficit.     ED Results / Procedures / Treatments   Labs (all labs ordered are listed, but only  abnormal results are displayed) Labs Reviewed  COMPREHENSIVE METABOLIC PANEL - Abnormal; Notable for the following components:      Result Value   Glucose, Bld 202 (*)    Alkaline Phosphatase 131 (*)    All other components within normal limits  TROPONIN I (HIGH SENSITIVITY) - Abnormal; Notable for the following components:   Troponin I (High Sensitivity) 49 (*)    All other components within normal limits  TROPONIN I (HIGH SENSITIVITY) - Abnormal; Notable for the following components:   Troponin I (High Sensitivity) 51 (*)    All other components within normal limits  RESP PANEL BY RT-PCR (FLU A&B, COVID) ARPGX2  CBC  LACTIC ACID, PLASMA  LACTIC ACID, PLASMA  BRAIN NATRIURETIC PEPTIDE    EKG EKG Interpretation  Date/Time:  Sunday November 10 2020 14:19:44 EST Ventricular Rate:  90 PR Interval:    QRS Duration: 96 QT Interval:  397 QTC Calculation: 486 R Axis:   19 Text Interpretation: Sinus rhythm Borderline T wave abnormalities Minimal ST elevation, anterior leads Borderline prolonged QT interval Baseline wander in lead(s) V2 12 Lead; [place] Abnormal ECG Confirmed by Gerhard Munch [phone]) on 11/10/2020 3:24:37 PM   Radiology DG Chest 2 View  Result Date: 11/10/2020 CLINICAL DATA:  Chest pain for 2 weeks while laying down. EXAM: CHEST - 2 VIEW COMPARISON:  12/13/2017 FINDINGS: Cardiac silhouette top-normal in size. No mediastinal or hilar masses or evidence of adenopathy. Lungs are mildly hyperexpanded, but clear. No pleural effusion or pneumothorax. Skeletal structures are intact. IMPRESSION: No active cardiopulmonary disease. Electronically Signed   By: Amie Portland M.D.   On: 11/10/2020 14:50    Procedures Procedures (including critical  care time)  Medications Ordered in ED Medications  lisinopril (ZESTRIL) tablet 10 mg (10 mg Oral Given 11/10/20 2059)  labetalol (NORMODYNE) injection 10 mg (10 mg Intravenous Given 11/10/20 2058)    ED Course  I have reviewed the triage vital signs and the nursing notes.  Pertinent labs & imaging results that were available during my care of the patient were reviewed by me and considered in my medical decision making (see chart for details).  Update:, Patient accompanied by her husband.  We discussed findings thus far, including physical exam, dyspnea, positional symptoms concerning for heart failure. Patient's BNP value is within normal limits, but with this consideration, and with ongoing hypertension, not substantially improved after receiving hydralazine here, she will be admitted for further monitoring, management.  Update: Patient taken troponin, essentially unchanged, but both are positive. Given these findings, patient's presentation with chest pain, concern for fluid overload status, discussed her case with her cardiology colleagues and they will follow as a consulting service in the morning.  Patient be admitted to our internal medicine team.  9:25 PM Blood pressure remains elevated, though there was a delay in the patient receiving her antihypertensives. Clarified with the patient and her husband about the plan, she is receiving labetalol for systolic pressure persistently over 200 in spite of hydralazine, as above.  Blood pressure did improve from arrival of systolic 240, to 784 systolic after provision of labetalol.  Final Clinical Impression(s) / ED Diagnoses Final diagnoses:  Atypical chest pain  SOB (shortness of breath)  Hypertensive urgency      Gerhard Munch, MD 11/10/20 2235

## 2020-11-10 NOTE — H&P (Signed)
History and Physical   Cheryl Mckee ZOX:[id number] DOB: [birth year] DOA: 11/10/2020  Referring MD/NP/PA: Dr. Jeraldine Loots  PCP: Georgianne Fick, MD   Outpatient Specialists: None  Patient coming from: Home  Chief Complaint: Shortness of breath  HPI: Cheryl Mckee is a 64 y.o. female with medical history significant of coronary artery disease, diabetes, hypertension, obstructive sleep apnea, fibromyalgia, who presented with significant shortness of breath with activity and when laying down flat. Associated with cough and some wheezing. Patient also felt some chest discomfort. She usually feels better when she is sitting is upright and standing. Denied any fever denied any chills denied any nausea vomiting or diarrhea. She was seen in the ER and has minimal elevation of her troponins. No significant EKG change. Patient suspected to have anginal equivalent. Cardiology consulted and recommended admission for rule out MI and rule out CHF. She has mild elevation of troponin and BNP was mild..  ED Course: Temperature 98.1 blood pressure 250/98 pulse 86 respirate 25 oxygen sat 92% room air. CBC and chemistry appear to be within normal. Glucose 202. BNP 99 troponin 49 and then 51 lactic acid 1.3. Viral screen negative. Chest x-ray showed no acute findings.  Review of Systems: As per HPI otherwise 10 point review of systems negative.    Past Medical History:  Diagnosis Date  . Coronary artery disease   . Diabetes mellitus without complication (HCC)    type 2  . Fibromyalgia   . Hypertension   . Sleep apnea    could not tolerate cpap, sleep study 10 yrs ago  . Urethral diverticulum 2015   evaluated by Dr. Wynelle Link  . Urinary incontinence     Past Surgical History:  Procedure Laterality Date  . CARDIAC CATHETERIZATION  2001   nonobstructive cad  . COLONOSCOPY  10/05/12  . CYSTOSCOPY  03/07/2020   Procedure: CYSTOSCOPY;  Surgeon: Crist Fat, MD;  Location: [facility];  Service: Urology;;  . DILATATION & CURETTAGE/HYSTEROSCOPY WITH MYOSURE N/A 09/27/2015   Procedure: DILATATION & CURETTAGE/HYSTEROSCOPY WITH MYOSURE;  Surgeon: Dara Lords, MD;  Location: [facility];  Service: Gynecology;  Laterality: N/A;  . FOOT SURGERY Left   . KNEE SURGERY Left    meniscus repair  . URETHRAL DIVERTICULUM REPAIR N/A 03/07/2020   Procedure: TRANSVAGINAL URETHRAL DIVERTICULECTOMY;  Surgeon: Crist Fat, MD;  Location: [facility];  Service: Urology;  Laterality: N/A;     reports that she has never smoked. She has never used smokeless tobacco. She reports that she does not drink alcohol and does not use drugs.  Allergies  Allergen Reactions  . Peanut-Containing Drug Products Anaphylaxis    NUT Allergy, can only eat peanuts and almonds  . Accupril [Quinapril Hcl] Hives  . Daypro [Oxaprozin] Other (See Comments)    GI upset  . Zithromax [Azithromycin] Hives    Family History  Problem Relation Age of Onset  . Diabetes Mother   . Hypertension Mother   . Diabetes Father   . Cancer Father        Deceased, 70s--Unknown type cancer  . Cancer Maternal Aunt        Ovarian or Uterine  . Breast cancer Cousin        Maternal 1st cousin-Age 20     Prior to Admission medications   Medication Sig Start Date End Date Taking? Authorizing Provider  acetaminophen (TYLENOL) 500 MG tablet Take 1,000 mg by mouth every 6 (six) hours as needed.  [provider]  atorvastatin (LIPITOR) 40 MG tablet Take 40 mg by mouth daily.    [provider]  Insulin Aspart (NOVOLOG [place]) Inject 45 Units into the skin daily.     [provider]  Insulin Degludec (TRESIBA FLEXTOUCH [place]) Inject into the skin. 160 mg in am    [provider]  lisinopril (PRINIVIL,ZESTRIL) 40 MG tablet Take 1 tablet (40 mg total) by mouth daily. 02/28/17   Everlene Farrier, PA-C  metFORMIN (GLUCOPHAGE) 500 MG tablet Take 500 mg by mouth 2 (two) times  daily with a meal.    [provider]  sulfamethoxazole-trimethoprim (BACTRIM DS) 800-160 MG tablet Take 1 tablet by mouth 2 (two) times daily. 03/07/20   Crist Fat, MD  traMADol (ULTRAM) 50 MG tablet Take 1-2 tablets (50-100 mg total) by mouth every 6 (six) hours as needed for moderate pain. 03/07/20   Crist Fat, MD  traZODone (DESYREL) 50 MG tablet Take 50 mg by mouth at bedtime as needed for sleep.     [provider]  UNABLE TO FIND Med Name: oxempic 1 mg weekly shot on wednesday    [provider]    Physical Exam: Vitals:   11/10/20 1800 11/10/20 1830 11/10/20 1919 11/10/20 2037  BP: (!) 220/87 (!) 213/90 (!) 236/93 (!) 211/85  Pulse: 75 77 78 77  Resp: 18 12 16 20   Temp:   97.9 F (36.6 C)   TempSrc:   Oral   SpO2: 99% 99% 97% 97%      Constitutional: Morbidly obese, no distress Vitals:   11/10/20 1800 11/10/20 1830 11/10/20 1919 11/10/20 2037  BP: (!) 220/87 (!) 213/90 (!) 236/93 (!) 211/85  Pulse: 75 77 78 77  Resp: 18 12 16 20   Temp:   97.9 F (36.6 C)   TempSrc:   Oral   SpO2: 99% 99% 97% 97%   Eyes: PERRL, lids and conjunctivae normal ENMT: Mucous membranes are dry. Posterior pharynx clear of any exudate or lesions.Normal dentition.  Neck: normal, supple, no masses, no thyromegaly Respiratory: clear to auscultation bilaterally, no wheezing, no crackles. Normal respiratory effort. No accessory muscle use.  Cardiovascular: Regular rate and rhythm, no murmurs / rubs / gallops. No extremity edema. 2+ pedal pulses. No carotid bruits.  Abdomen: no tenderness, no masses palpated. No hepatosplenomegaly. Bowel sounds positive.  Musculoskeletal: no clubbing / cyanosis. No joint deformity upper and lower extremities. Good ROM, no contractures. Normal muscle tone.  Skin: no rashes, lesions, ulcers. No induration Neurologic: CN 2-12 grossly intact. Sensation intact, DTR normal. Strength 5/5 in all 4.  Psychiatric: Normal judgment and  insight. Alert and oriented x 3. Normal mood.     Labs on Admission: I have personally reviewed following labs and imaging studies  CBC: Recent Labs  Lab 11/10/20 1545  WBC 10.3  HGB 12.5  HCT 39.8  MCV 83.1  PLT 223   Basic Metabolic Panel: Recent Labs  Lab 11/10/20 1545  NA 137  K 3.8  CL 100  CO2 27  GLUCOSE 202*  BUN 12  CREATININE 0.86  CALCIUM 9.0   GFR: CrCl cannot be calculated (Unknown ideal weight.). Liver Function Tests: Recent Labs  Lab 11/10/20 1545  AST 41  ALT 38  ALKPHOS 131*  BILITOT 0.6  PROT 7.8  ALBUMIN 3.7   No results for input(s): LIPASE, AMYLASE in the last 168 hours. No results for input(s): AMMONIA in the last 168 hours. Coagulation Profile: No results for input(s):  INR, PROTIME in the last 168 hours. Cardiac Enzymes: No results for input(s): CKTOTAL, CKMB, CKMBINDEX, TROPONINI in the last 168 hours. BNP (last 3 results) No results for input(s): PROBNP in the last 8760 hours. HbA1C: No results for input(s): HGBA1C in the last 72 hours. CBG: No results for input(s): GLUCAP in the last 168 hours. Lipid Profile: No results for input(s): CHOL, HDL, LDLCALC, TRIG, CHOLHDL, LDLDIRECT in the last 72 hours. Thyroid Function Tests: No results for input(s): TSH, T4TOTAL, FREET4, T3FREE, THYROIDAB in the last 72 hours. Anemia Panel: No results for input(s): VITAMINB12, FOLATE, FERRITIN, TIBC, IRON, RETICCTPCT in the last 72 hours. Urine analysis:    Component Value Date/Time   COLORURINE DARK YELLOW 10/27/2019 1248   APPEARANCEUR TURBID (A) 10/27/2019 1248   LABSPEC 1.030 10/27/2019 1248   PHURINE 6.0 10/27/2019 1248   GLUCOSEU 2+ (A) 10/27/2019 1248   HGBUR NEGATIVE 10/27/2019 1248   BILIRUBINUR negative 01/23/2016 1916   KETONESUR TRACE (A) 10/27/2019 1248   PROTEINUR 2+ (A) 10/27/2019 1248   UROBILINOGEN 0.2 01/23/2016 1916   UROBILINOGEN 0.2 12/27/2013 1210   NITRITE Negative 01/23/2016 1916   NITRITE NEGATIVE 07/18/2015  0848   LEUKOCYTESUR Trace (A) 01/23/2016 1916   Sepsis Labs: @LABRCNTIP (procalcitonin:4,lacticidven:4) )No results found for this or any previous visit (from the past 240 hour(s)).   Radiological Exams on Admission: DG Chest 2 View  Result Date: 11/10/2020 CLINICAL DATA:  Chest pain for 2 weeks while laying down. EXAM: CHEST - 2 VIEW COMPARISON:  12/13/2017 FINDINGS: Cardiac silhouette top-normal in size. No mediastinal or hilar masses or evidence of adenopathy. Lungs are mildly hyperexpanded, but clear. No pleural effusion or pneumothorax. Skeletal structures are intact. IMPRESSION: No active cardiopulmonary disease. Electronically Signed   By: 02/10/2018 M.D.   On: 11/10/2020 14:50    EKG: Independently reviewed. Acute sinus rhythm with a rate of 91, no significant Q wave. Mild ST changes in the lateral leads.  Assessment/Plan Principal Problem:   Exertional dyspnea Active Problems:   Cirrhosis, biliary (HCC)   Diabetes mellitus without complication (HCC)   Hypertension   Morbid obesity (HCC)   Obstructive sleep apnea     #1 exertional dyspnea: Appears to have angina equivalent. Patient will be admitted. Echocardiogram ordered. Run enzymes. Repeat EKG in the morning. Cardiology to follow and make a decision.  #2 diabetes: Sliding scale insulin  #3 essential hypertension: Patient has whitecoat hypertension also. With initial home regimen and titrate.  #4 obstructive sleep apnea: Will be recommending CPAP.  #5 morbid obesity: Dietary counseling  #6 history of coronary artery disease: Continue to cycle enzymes   DVT prophylaxis: Lovenox Code Status: Full code Family Communication: Husband over the phone Disposition Plan: Home Consults called: Cardiology from [facility] Admission status: Observation  Severity of Illness: The appropriate patient status for this patient is OBSERVATION. Observation status is judged to be reasonable and necessary in order to  provide the required intensity of service to ensure the patient's safety. The patient's presenting symptoms, physical exam findings, and initial radiographic and laboratory data in the context of their medical condition is felt to place them at decreased risk for further clinical deterioration. Furthermore, it is anticipated that the patient will be medically stable for discharge from the hospital within 2 midnights of admission. The following factors support the patient status of observation.   " The patient's presenting symptoms include exertional dyspnea. " The physical exam findings include no significant findings. " The initial radiographic and laboratory data  are mild troponins.     Lonia BloodGARBA,LAWAL MD [facility] Pager [phone]  If 7PM-7AM, please contact night-coverage [email or web address] Password [email or web address]  11/10/2020, 8:52 PM

## 2020-11-11 ENCOUNTER — Observation Stay (HOSPITAL_BASED_OUTPATIENT_CLINIC_OR_DEPARTMENT_OTHER): Payer: 59

## 2020-11-11 ENCOUNTER — Encounter (HOSPITAL_COMMUNITY): Payer: Self-pay | Admitting: Internal Medicine

## 2020-11-11 DIAGNOSIS — I10 Essential (primary) hypertension: Secondary | ICD-10-CM | POA: Diagnosis not present

## 2020-11-11 DIAGNOSIS — R0600 Dyspnea, unspecified: Secondary | ICD-10-CM | POA: Diagnosis not present

## 2020-11-11 DIAGNOSIS — R0602 Shortness of breath: Secondary | ICD-10-CM | POA: Diagnosis not present

## 2020-11-11 DIAGNOSIS — G4733 Obstructive sleep apnea (adult) (pediatric): Secondary | ICD-10-CM | POA: Diagnosis not present

## 2020-11-11 LAB — CBG MONITORING, ED
Glucose-Capillary: 201 mg/dL — ABNORMAL HIGH (ref 70–99)
Glucose-Capillary: 157 mg/dL — ABNORMAL HIGH (ref 70–99)
Glucose-Capillary: 138 mg/dL — ABNORMAL HIGH (ref 70–99)
Glucose-Capillary: 114 mg/dL — ABNORMAL HIGH (ref 70–99)
Glucose-Capillary: 40 mg/dL — CL (ref 70–99)
Glucose-Capillary: 137 mg/dL — ABNORMAL HIGH (ref 70–99)

## 2020-11-11 LAB — ECHOCARDIOGRAM COMPLETE
Area-P 1/2: 3.08 cm2
S' Lateral: 2.5 cm

## 2020-11-11 LAB — CREATININE, SERUM
Creatinine, Ser: 0.94 mg/dL (ref 0.44–1.00)
GFR, Estimated: >60 mL/min (ref >60)

## 2020-11-11 LAB — LIPID PANEL
Cholesterol: 140 mg/dL (ref 0–200)
HDL: 37 mg/dL — ABNORMAL LOW (ref >40)
LDL Cholesterol: 91 mg/dL (ref 0–99)
Total CHOL/HDL Ratio: 3.8 RATIO
Triglycerides: 59 mg/dL (ref <150)
VLDL: 12 mg/dL (ref 0–40)

## 2020-11-11 LAB — HEMOGLOBIN A1C
Hgb A1c MFr Bld: 9 % — ABNORMAL HIGH (ref 4.8–5.6)
Mean Plasma Glucose: 211.6 mg/dL

## 2020-11-11 LAB — CBC
HCT: 38 % (ref 36.0–46.0)
Hemoglobin: 12.1 g/dL (ref 12.0–15.0)
MCH: 26.3 pg (ref 26.0–34.0)
MCHC: 31.8 g/dL (ref 30.0–36.0)
MCV: 82.6 fL (ref 80.0–100.0)
Platelets: 244 10*3/uL (ref 150–400)
RBC: 4.6 MIL/uL (ref 3.87–5.11)
RDW: 14.5 % (ref 11.5–15.5)
WBC: 10.9 10*3/uL — ABNORMAL HIGH (ref 4.0–10.5)
nRBC: 0 % (ref 0.0–0.2)

## 2020-11-11 LAB — HIV ANTIBODY (ROUTINE TESTING W REFLEX): HIV Screen 4th Generation wRfx: NONREACTIVE

## 2020-11-11 LAB — TROPONIN I (HIGH SENSITIVITY): Troponin I (High Sensitivity): 32 ng/L — ABNORMAL HIGH (ref <18)

## 2020-11-11 LAB — GLUCOSE, CAPILLARY: Glucose-Capillary: 73 mg/dL (ref 70–99)

## 2020-11-11 MED ORDER — INSULIN ASPART 100 UNIT/ML ~~LOC~~ SOLN
0.0000 [IU] | Freq: Three times a day (TID) | SUBCUTANEOUS | Status: DC
  Administered 2020-11-11: 3 [IU] via SUBCUTANEOUS
  Filled 2020-11-11: qty 0.2

## 2020-11-11 MED ORDER — METOPROLOL TARTRATE 25 MG PO TABS
25.0000 mg | ORAL_TABLET | Freq: Two times a day (BID) | ORAL | Status: DC
  Administered 2020-11-11 – 2020-11-12 (×4): 25 mg via ORAL
  Filled 2020-11-11 (×4): qty 1

## 2020-11-11 MED ORDER — INSULIN ASPART 100 UNIT/ML ~~LOC~~ SOLN
0.0000 [IU] | Freq: Every day | SUBCUTANEOUS | Status: DC
  Administered 2020-11-11: 2 [IU] via SUBCUTANEOUS
  Filled 2020-11-11: qty 0.05

## 2020-11-11 MED ORDER — ENOXAPARIN SODIUM 120 MG/0.8ML ~~LOC~~ SOLN
120.0000 mg | Freq: Two times a day (BID) | SUBCUTANEOUS | Status: DC
  Administered 2020-11-11 – 2020-11-12 (×4): 120 mg via SUBCUTANEOUS
  Filled 2020-11-11 (×4): qty 0.8

## 2020-11-11 MED ORDER — ONDANSETRON HCL 4 MG/2ML IJ SOLN: 4.0000 mg | Freq: Four times a day (QID) | INTRAMUSCULAR | Status: DC | PRN

## 2020-11-11 MED ORDER — LOPERAMIDE HCL 2 MG PO CAPS
2.0000 mg | ORAL_CAPSULE | Freq: Four times a day (QID) | ORAL | Status: DC | PRN
  Administered 2020-11-11: 2 mg via ORAL
  Filled 2020-11-11: qty 1

## 2020-11-11 MED ORDER — ACETAMINOPHEN 325 MG PO TABS: 650.0000 mg | ORAL_TABLET | ORAL | Status: DC | PRN

## 2020-11-11 MED ORDER — FUROSEMIDE 40 MG PO TABS
40.0000 mg | ORAL_TABLET | Freq: Every day | ORAL | Status: DC
  Administered 2020-11-11 – 2020-11-12 (×2): 40 mg via ORAL
  Filled 2020-11-11 (×2): qty 1

## 2020-11-11 MED ORDER — DEXTROSE 50 % IV SOLN
INTRAVENOUS | Status: AC
  Administered 2020-11-11: 50 mL
  Filled 2020-11-11: qty 50

## 2020-11-11 NOTE — Progress Notes (Signed)
Attempted echo.  Patient's requests to come back...needs to use restroom - VS

## 2020-11-11 NOTE — Discharge Summary (Addendum)
Physician Discharge Summary  Cheryl BalesSharon A Mckee WUJ:[id number] DOB: 09/04/1956 DOA: 11/10/2020  PCP: Georgianne Fickamachandran, Ajith, MD  Admit date: 11/10/2020 Discharge date: 11/11/2020  Admitted From: Home Disposition: Home  Recommendations for Outpatient Follow-up:  1. Follow up with PCP in 1 week with repeat CBC/BMP 2. Outpatient follow-up with cardiology 3. Follow up in ED if symptoms worsen or new appear   Home Health: No Equipment/Devices: None  Discharge Condition: Stable CODE STATUS: Full Diet recommendation: Heart healthy/carb modified  Brief/Interim Summary:  64 y.o. female with medical history significant of coronary artery disease, diabetes, hypertension, obstructive sleep apnea, fibromyalgia, who presented with significant shortness of breath with activity and when laying down flat. Associated with cough and some wheezing. Patient also felt some chest discomfort.  On presentation, she had minimal elevation of her troponins without significant EKG change.  Cardiology was consulted and recommended observation to rule out MI/CHF.  Troponin was 49 and then 51; BNP was 99; chest x-ray was negative for acute findings.  During the hospitalization, her symptoms have improved.  Cardiology was consulted; echo showed EF of 60 to 65%.  She was started on oral Lasix by cardiology and cleared by cardiology for discharge with outpatient follow-up with them.  She will be discharged home today.  Discharge Diagnoses:   Exertional dyspnea Minimally elevated troponin in a patient with history of CAD Hyperlipidemia -Presented with exertional dyspnea and minimally elevated troponin of 49 and then 51.  EKG was nonischemic.  Cardiology was consulted.  echo showed EF of 60 to 65%.  She was started on oral Lasix by cardiology and cleared by cardiology for discharge with outpatient follow-up with them.  She will be discharged home today. -Continue statin.  Continue metoprolol  Hypertension -Blood pressure on the  higher side.  Lisinopril dose of increased to 40 mg daily by cardiology which will be continued on discharge.  Continue metoprolol and Lasix.  Outpatient follow-up.  Obstructive sleep apnea -Currently not on CPAP at home.  Will need repeat sleep study as an outpatient and subsequent CPAP  Morbid obesity -will need weight loss and outpatient follow-up  Diabetes mellitus type II uncontrolled with hyperglycemia and hypoglycemia -A1c of 9.  Patient needs to be compliant with carb modified diet at home.  Outpatient follow-up with PCP.  Continue home regimen.   Discharge Instructions    Allergies as of 11/12/2020      Reactions   Other Anaphylaxis   Allergy to cashews, pecans, walnuts, [place] nuts (can only eat peanuts and almonds)   Accupril [quinapril Hcl] Hives   Daypro [oxaprozin] Other (See Comments)   GI upset   Zithromax [azithromycin] Hives      Medication List    STOP taking these medications   traMADol 50 MG tablet Commonly known as: Ultram     TAKE these medications   acetaminophen 500 MG tablet Commonly known as: TYLENOL Take 1,000 mg by mouth every 6 (six) hours as needed for headache (pain).   atorvastatin 40 MG tablet Commonly known as: LIPITOR Take 40 mg by mouth daily.   COLLAGEN PO Take 1 tablet by mouth daily.   EYE VITAMINS PO Take 2 capsules by mouth daily.   furosemide 40 MG tablet Commonly known as: LASIX Take 1 tablet (40 mg total) by mouth daily. Start taking on: November 13, 2020   lisinopril 40 MG tablet Commonly known as: ZESTRIL Take 1 tablet (40 mg total) by mouth daily. What changed: Another medication with the same name was removed.  Continue taking this medication, and follow the directions you see here.   loperamide 2 MG capsule Commonly known as: IMODIUM Take 1 capsule (2 mg total) by mouth every 6 (six) hours as needed for diarrhea or loose stools.   metFORMIN 500 MG 24 hr tablet Commonly known as: GLUCOPHAGE-XR Take 1,000 mg  by mouth 2 (two) times daily with a meal.   metoprolol tartrate 25 MG tablet Commonly known as: LOPRESSOR Take 1 tablet (25 mg total) by mouth 2 (two) times daily.   multivitamin with minerals Tabs tablet Take 1 tablet by mouth daily.   NovoLOG FlexPen 100 UNIT/ML FlexPen Generic drug: insulin aspart Inject 13-55 Units into the skin See admin instructions. Inject 25 units subcutaneously before breakfast, 13 units before lunch and 55 units before supper   nystatin powder Commonly known as: MYCOSTATIN/NYSTOP Apply 1 application topically daily as needed (rash (skin folds)).   Ozempic (1 MG/DOSE) 2 MG/1.5ML Sopn Generic drug: Semaglutide (1 MG/DOSE) Inject 1 mg into the skin every Wednesday.   potassium chloride SA 20 MEQ tablet Commonly known as: KLOR-CON Take 1 tablet (20 mEq total) by mouth daily.   traZODone 50 MG tablet Commonly known as: DESYREL Take 50 mg by mouth at bedtime as needed for sleep.   Evaristo Bury FlexTouch 200 UNIT/ML FlexTouch Pen Generic drug: insulin degludec Inject 125 Units into the skin daily before breakfast.   VITAMIN C PO Take 2 tablets by mouth daily.         Follow-up Information    Georgianne Fick, MD. Schedule an appointment as soon as possible for a visit in 1 week(s).   Specialty: Internal Medicine Contact information: [address] [place] [address] [phone]              Allergies  Allergen Reactions  . Other Anaphylaxis    Allergy to cashews, pecans, walnuts, [place] nuts (can only eat peanuts and almonds)  . Accupril [Quinapril Hcl] Hives  . Daypro [Oxaprozin] Other (See Comments)    GI upset  . Zithromax [Azithromycin] Hives    Consultations:  Cardiology   Procedures/Studies: DG Chest 2 View  Result Date: 11/10/2020 CLINICAL DATA:  Chest pain for 2 weeks while laying down. EXAM: CHEST - 2 VIEW COMPARISON:  12/13/2017 FINDINGS: Cardiac silhouette top-normal in size. No mediastinal or  hilar masses or evidence of adenopathy. Lungs are mildly hyperexpanded, but clear. No pleural effusion or pneumothorax. Skeletal structures are intact. IMPRESSION: No active cardiopulmonary disease. Electronically Signed   By: Amie Portland M.D.   On: 11/10/2020 14:50    Echo 11/11/2020 1. Difficult study due to patient body habitus. EF overall normal 60-65%.  Diastolic evaluation was non-diagnostic due to poor quality images.  2. Left ventricular ejection fraction, by estimation, is 60 to 65%. The  left ventricle has normal function. The left ventricle has no regional  wall motion abnormalities. Left ventricular diastolic function could not  be evaluated.  3. Right ventricular systolic function is normal. The right ventricular  size is normal. Tricuspid regurgitation signal is inadequate for assessing  PA pressure.  4. The mitral valve is degenerative. No evidence of mitral valve  regurgitation. No evidence of mitral stenosis.  5. The aortic valve is grossly normal. Aortic valve regurgitation is not  visualized. No aortic stenosis is present.    Subjective: Patient seen and examined at bedside.  She feels better and is hoping to go home today.  Denies any current worsening shortness breath or chest pain.  No overnight fever or vomiting reported.  Discharge Exam: Vitals:   11/11/20 1030 11/11/20 1100  BP: (!) 183/85 (!) 189/71  Pulse: 75 69  Resp: 15 (!) 30  Temp:    SpO2: 97% 97%    General: Pt is alert, awake, not in acute distress Cardiovascular: rate controlled, S1/S2 + Respiratory: bilateral decreased breath sounds at bases Abdominal: Soft, morbidly obese, NT, ND, bowel sounds + Extremities: Trace lower extremity edema; no cyanosis    The results of significant diagnostics from this hospitalization (including imaging, microbiology, ancillary and laboratory) are listed below for reference.     Microbiology: Recent Results (from the past 240 hour(s))  Resp Panel by  RT-PCR (Flu A&B, Covid) Nasopharyngeal Swab     Status: None   Collection Time: 11/10/20  7:02 PM   Specimen: Nasopharyngeal Swab; Nasopharyngeal(NP) swabs in vial transport medium  Result Value Ref Range Status   SARS Coronavirus 2 by RT PCR NEGATIVE NEGATIVE Final    Comment: (NOTE) SARS-CoV-2 target nucleic acids are NOT DETECTED.  The SARS-CoV-2 RNA is generally detectable in upper respiratory specimens during the acute phase of infection. The lowest concentration of SARS-CoV-2 viral copies this assay can detect is 138 copies/mL. A negative result does not preclude SARS-Cov-2 infection and should not be used as the sole basis for treatment or other patient management decisions. A negative result may occur with  improper specimen collection/handling, submission of specimen other than nasopharyngeal swab, presence of viral mutation(s) within the areas targeted by this assay, and inadequate number of viral copies(<138 copies/mL). A negative result must be combined with clinical observations, patient history, and epidemiological information. The expected result is Negative.  Fact Sheet for Patients:  BloggerCourse.com  Fact Sheet for Healthcare Providers:  SeriousBroker.it  This test is no t yet approved or cleared by the [place] FDA and  has been authorized for detection and/or diagnosis of SARS-CoV-2 by FDA under an Emergency Use Authorization (EUA). This EUA will remain  in effect (meaning this test can be used) for the duration of the COVID-19 declaration under Section 564(b)(1) of the Act, 21 U.S.C.section 360bbb-3(b)(1), unless the authorization is terminated  or revoked sooner.       Influenza A by PCR NEGATIVE NEGATIVE Final   Influenza B by PCR NEGATIVE NEGATIVE Final    Comment: (NOTE) The Xpert Xpress SARS-CoV-2/FLU/RSV plus assay is intended as an aid in the diagnosis of influenza from Nasopharyngeal swab  specimens and should not be used as a sole basis for treatment. Nasal washings and aspirates are unacceptable for Xpert Xpress SARS-CoV-2/FLU/RSV testing.  Fact Sheet for Patients: BloggerCourse.com  Fact Sheet for Healthcare Providers: SeriousBroker.it  This test is not yet approved or cleared by the [place] FDA and has been authorized for detection and/or diagnosis of SARS-CoV-2 by FDA under an Emergency Use Authorization (EUA). This EUA will remain in effect (meaning this test can be used) for the duration of the COVID-19 declaration under Section 564(b)(1) of the Act, 21 U.S.C. section 360bbb-3(b)(1), unless the authorization is terminated or revoked.  Performed at [facility], [address] [place], [place] [address]      Labs: BNP (last 3 results) Recent Labs    11/10/20 1545  BNP 99.4   Basic Metabolic Panel: Recent Labs  Lab 11/10/20 1545 11/11/20 0153  NA 137  --   K 3.8  --   CL 100  --   CO2 27  --   GLUCOSE 202*  --  BUN 12  --   CREATININE 0.86 0.94  CALCIUM 9.0  --    Liver Function Tests: Recent Labs  Lab 11/10/20 1545  AST 41  ALT 38  ALKPHOS 131*  BILITOT 0.6  PROT 7.8  ALBUMIN 3.7   No results for input(s): LIPASE, AMYLASE in the last 168 hours. No results for input(s): AMMONIA in the last 168 hours. CBC: Recent Labs  Lab 11/10/20 1545 11/11/20 0153  WBC 10.3 10.9*  HGB 12.5 12.1  HCT 39.8 38.0  MCV 83.1 82.6  PLT 223 244   Cardiac Enzymes: No results for input(s): CKTOTAL, CKMB, CKMBINDEX, TROPONINI in the last 168 hours. BNP: Invalid input(s): POCBNP CBG: Recent Labs  Lab 11/11/20 0135 11/11/20 0835 11/11/20 1044  GLUCAP 201* 157* 138*   D-Dimer No results for input(s): DDIMER in the last 72 hours. Hgb A1c Recent Labs    11/11/20 0153  HGBA1C 9.0*   Lipid Profile Recent Labs    11/11/20 0153  CHOL 140  HDL 37*  LDLCALC 91   TRIG 59  CHOLHDL 3.8   Thyroid function studies No results for input(s): TSH, T4TOTAL, T3FREE, THYROIDAB in the last 72 hours.  Invalid input(s): FREET3 Anemia work up No results for input(s): VITAMINB12, FOLATE, FERRITIN, TIBC, IRON, RETICCTPCT in the last 72 hours. Urinalysis    Component Value Date/Time   COLORURINE DARK YELLOW 10/27/2019 1248   APPEARANCEUR TURBID (A) 10/27/2019 1248   LABSPEC 1.030 10/27/2019 1248   PHURINE 6.0 10/27/2019 1248   GLUCOSEU 2+ (A) 10/27/2019 1248   HGBUR NEGATIVE 10/27/2019 1248   BILIRUBINUR negative 01/23/2016 1916   KETONESUR TRACE (A) 10/27/2019 1248   PROTEINUR 2+ (A) 10/27/2019 1248   UROBILINOGEN 0.2 01/23/2016 1916   UROBILINOGEN 0.2 12/27/2013 1210   NITRITE Negative 01/23/2016 1916   NITRITE NEGATIVE 07/18/2015 0848   LEUKOCYTESUR Trace (A) 01/23/2016 1916   Sepsis Labs Invalid input(s): PROCALCITONIN,  WBC,  LACTICIDVEN Microbiology Recent Results (from the past 240 hour(s))  Resp Panel by RT-PCR (Flu A&B, Covid) Nasopharyngeal Swab     Status: None   Collection Time: 11/10/20  7:02 PM   Specimen: Nasopharyngeal Swab; Nasopharyngeal(NP) swabs in vial transport medium  Result Value Ref Range Status   SARS Coronavirus 2 by RT PCR NEGATIVE NEGATIVE Final    Comment: (NOTE) SARS-CoV-2 target nucleic acids are NOT DETECTED.  The SARS-CoV-2 RNA is generally detectable in upper respiratory specimens during the acute phase of infection. The lowest concentration of SARS-CoV-2 viral copies this assay can detect is 138 copies/mL. A negative result does not preclude SARS-Cov-2 infection and should not be used as the sole basis for treatment or other patient management decisions. A negative result may occur with  improper specimen collection/handling, submission of specimen other than nasopharyngeal swab, presence of viral mutation(s) within the areas targeted by this assay, and inadequate number of viral copies(<138 copies/mL). A  negative result must be combined with clinical observations, patient history, and epidemiological information. The expected result is Negative.  Fact Sheet for Patients:  BloggerCourse.com  Fact Sheet for Healthcare Providers:  SeriousBroker.it  This test is no t yet approved or cleared by the [place] FDA and  has been authorized for detection and/or diagnosis of SARS-CoV-2 by FDA under an Emergency Use Authorization (EUA). This EUA will remain  in effect (meaning this test can be used) for the duration of the COVID-19 declaration under Section 564(b)(1) of the Act, 21 U.S.C.section 360bbb-3(b)(1), unless the authorization is terminated  or  revoked sooner.       Influenza A by PCR NEGATIVE NEGATIVE Final   Influenza B by PCR NEGATIVE NEGATIVE Final    Comment: (NOTE) The Xpert Xpress SARS-CoV-2/FLU/RSV plus assay is intended as an aid in the diagnosis of influenza from Nasopharyngeal swab specimens and should not be used as a sole basis for treatment. Nasal washings and aspirates are unacceptable for Xpert Xpress SARS-CoV-2/FLU/RSV testing.  Fact Sheet for Patients: BloggerCourse.com  Fact Sheet for Healthcare Providers: SeriousBroker.it  This test is not yet approved or cleared by the [place] FDA and has been authorized for detection and/or diagnosis of SARS-CoV-2 by FDA under an Emergency Use Authorization (EUA). This EUA will remain in effect (meaning this test can be used) for the duration of the COVID-19 declaration under Section 564(b)(1) of the Act, 21 U.S.C. section 360bbb-3(b)(1), unless the authorization is terminated or revoked.  Performed at [facility], [address], [place], [place] [address]      Time coordinating discharge: 35 minutes  SIGNED:   Glade Lloyd, MD  [facility] 11/11/2020, 11:38 AM

## 2020-11-11 NOTE — ED Notes (Addendum)
Pt rounding complete. Pt resting comfortably with bed in lowest position and side rails up x2. Meal tray provided.Vitals updated. All needs met at this time. Will continue to monitor.  

## 2020-11-11 NOTE — Consult Note (Signed)
Cardiology Consultation:   Patient ID: Cheryl Mckee MRN: [id number]; DOB: 07-20-1956  Admit date: 11/10/2020 Date of Consult: 11/11/2020  Primary Care Provider: Georgianne Fick, MD [facility] Cardiologist: No primary care provider on file.  [facility] Electrophysiologist:  None   Called for consult regarding patient being admitted to [facility] for SOB.  All of the below information was obtained via discussion with primary team and chart review. Cardiology to follow up in the morning with additional recs.   Patient Profile:   64F with CAD, DM2, HTN, OSA, and fibromyalgia who presented with shortness of breath, cough, wheezing, abdominal chest discomfort.  History of Present Illness:   ECG with NSR (90) and no acute ST changes from prior comparison Labs only significant for mildly elevated hsT (49->51->32) Lipids with LDL (91), TG (59) A1c (9.0)  Hb at baseline (12.1 today), mild leukocytosis WBC (10.9) Flu/covid negative CXR without pulmonary infiltrates or focal consolidation  Past Medical History:  Diagnosis Date  . Coronary artery disease   . Diabetes mellitus without complication (HCC)    type 2  . Fibromyalgia   . Hypertension   . Sleep apnea    could not tolerate cpap, sleep study 10 yrs ago  . Urethral diverticulum 2015   evaluated by Dr. Wynelle Link  . Urinary incontinence    Past Surgical History:  Procedure Laterality Date  . CARDIAC CATHETERIZATION  2001   nonobstructive cad  . COLONOSCOPY  10/05/12  . CYSTOSCOPY  03/07/2020   Procedure: CYSTOSCOPY;  Surgeon: Crist Fat, MD;  Location: [facility];  Service: Urology;;  . DILATATION & CURETTAGE/HYSTEROSCOPY WITH MYOSURE N/A 09/27/2015   Procedure: DILATATION & CURETTAGE/HYSTEROSCOPY WITH MYOSURE;  Surgeon: Dara Lords, MD;  Location: [facility];  Service: Gynecology;  Laterality: N/A;  . FOOT SURGERY Left   . KNEE SURGERY Left    meniscus repair  . URETHRAL DIVERTICULUM REPAIR  N/A 03/07/2020   Procedure: TRANSVAGINAL URETHRAL DIVERTICULECTOMY;  Surgeon: Crist Fat, MD;  Location: [facility];  Service: Urology;  Laterality: N/A;    Home Medications:  Prior to Admission medications   Medication Sig Start Date End Date Taking? Authorizing Provider  acetaminophen (TYLENOL) 500 MG tablet Take 1,000 mg by mouth every 6 (six) hours as needed for headache (pain).    Yes [provider]  Ascorbic Acid (VITAMIN C PO) Take 2 tablets by mouth daily.   Yes [provider]  atorvastatin (LIPITOR) 40 MG tablet Take 40 mg by mouth daily.   Yes [provider]  COLLAGEN PO Take 1 tablet by mouth daily.   Yes [provider]  insulin aspart (NOVOLOG FLEXPEN) 100 UNIT/ML FlexPen Inject 13-55 Units into the skin See admin instructions. Inject 25 units subcutaneously before breakfast, 13 units before lunch and 55 units before supper   Yes [provider]  insulin degludec (TRESIBA FLEXTOUCH) 200 UNIT/ML FlexTouch Pen Inject 125 Units into the skin daily before breakfast.   Yes [provider]  lisinopril (ZESTRIL) 20 MG tablet Take 20 mg by mouth daily.   Yes [provider]  metFORMIN (GLUCOPHAGE-XR) 500 MG 24 hr tablet Take 1,000 mg by mouth 2 (two) times daily with a meal.   Yes [provider]  Multiple Vitamin (MULTIVITAMIN WITH MINERALS) TABS tablet Take 1 tablet by mouth daily.   Yes [provider]  Multiple Vitamins-Minerals (EYE VITAMINS PO) Take 2 capsules by mouth daily.   Yes [provider]  nystatin (  MYCOSTATIN/NYSTOP) powder Apply 1 application topically daily as needed (rash (skin folds)).   Yes [provider]  Semaglutide, 1 MG/DOSE, (OZEMPIC, 1 MG/DOSE,) 2 MG/1.5ML SOPN Inject 1 mg into the skin every Wednesday.   Yes [provider]  traMADol (ULTRAM) 50 MG tablet Take 1-2 tablets (50-100 mg total) by mouth every 6 (six) hours as needed for  moderate pain. 03/07/20  Yes Crist Fat, MD  traZODone (DESYREL) 50 MG tablet Take 50 mg by mouth at bedtime as needed for sleep.    Yes [provider]  lisinopril (PRINIVIL,ZESTRIL) 40 MG tablet Take 1 tablet (40 mg total) by mouth daily. Patient not taking: Reported on 11/10/2020 02/28/17   Everlene Farrier, PA-C    Inpatient Medications: Scheduled Meds: . vitamin C  250 mg Oral Daily  . atorvastatin  40 mg Oral Daily  . enoxaparin (LOVENOX) injection  120 mg Subcutaneous BID  . insulin aspart  0-20 Units Subcutaneous TID WC  . insulin aspart  0-5 Units Subcutaneous QHS  . insulin aspart  25 Units Subcutaneous Q breakfast   And  . insulin aspart  13 Units Subcutaneous Q lunch   And  . insulin aspart  55 Units Subcutaneous Q supper  . insulin glargine  125 Units Subcutaneous Daily  . lisinopril  20 mg Oral Daily  . metFORMIN  1,000 mg Oral BID WC  . metoprolol tartrate  25 mg Oral BID  . multivitamin  1 tablet Oral QHS  . multivitamin with minerals  1 tablet Oral Daily  . [START ON 11/13/2020] Semaglutide (1 MG/DOSE)  1 mg Subcutaneous Q Wed   Continuous Infusions:  PRN Meds: acetaminophen, nystatin, ondansetron (ZOFRAN) IV, traMADol, traZODone  Allergies:    Allergies  Allergen Reactions  . Other Anaphylaxis    Allergy to cashews, pecans, walnuts, [place] nuts (can only eat peanuts and almonds)  . Accupril [Quinapril Hcl] Hives  . Daypro [Oxaprozin] Other (See Comments)    GI upset  . Zithromax [Azithromycin] Hives    Social History:   Social History   Socioeconomic History  . Marital status: Married    Spouse name: Stephannie Peters  . Number of children: 0  . Years of education: Not on file  . Highest education level: Not on file  Occupational History  . Not on file  Tobacco Use  . Smoking status: Never Smoker  . Smokeless tobacco: Never Used  Vaping Use  . Vaping Use: Never used  Substance and Sexual Activity  . Alcohol use: No    Alcohol/week: 0.0  standard drinks  . Drug use: No  . Sexual activity: Yes    Birth control/protection: Post-menopausal    Comment: 1st intercourse 64 yo-Fewer than 5 partners  Other Topics Concern  . Not on file  Social History Narrative   Lives with husband in one story home.  No children.   BS degree.   Customer Service at [employer].            Social Determinants of Health   Financial Resource Strain:   . Difficulty of Paying Living Expenses: Not on file  Food Insecurity:   . Worried About Programme researcher, broadcasting/film/video in the Last Year: Not on file  . Ran Out of Food in the Last Year: Not on file  Transportation Needs:   . Lack of Transportation (Medical): Not on file  . Lack of Transportation (Non-Medical): Not on file  Physical Activity:   . Days of Exercise per  Week: Not on file  . Minutes of Exercise per Session: Not on file  Stress:   . Feeling of Stress : Not on file  Social Connections:   . Frequency of Communication with Friends and Family: Not on file  . Frequency of Social Gatherings with Friends and Family: Not on file  . Attends Religious Services: Not on file  . Active Member of Clubs or Organizations: Not on file  . Attends Banker Meetings: Not on file  . Marital Status: Not on file  Intimate Partner Violence:   . Fear of Current or Ex-Partner: Not on file  . Emotionally Abused: Not on file  . Physically Abused: Not on file  . Sexually Abused: Not on file    Family History:   Family History  Problem Relation Age of Onset  . Diabetes Mother   . Hypertension Mother   . Diabetes Father   . Cancer Father        Deceased, 70s--Unknown type cancer  . Cancer Maternal Aunt        Ovarian or Uterine  . Breast cancer Cousin        Maternal 1st cousin-Age 47    ROS:  Review of Systems: [y] = yes, [ ]  = no       General: Weight gain [ ] ; Weight loss [ ] ; Anorexia [ ] ; Fatigue [ ] ; Fever [ ] ; Chills [ ] ; Weakness [ ]     Cardiac: Chest pain/pressure [ ] ;  Resting SOB [ ] ; Exertional SOB [y]; Orthopnea [ ] ; Pedal Edema [ ] ; Palpitations [ ] ; Syncope [ ] ; Presyncope [ ] ; Paroxysmal nocturnal dyspnea [ ]     Pulmonary: Cough [y]; Wheezing [y]; Hemoptysis [ ] ; Sputum [ ] ; Snoring [ ]     GI: Vomiting [ ] ; Dysphagia [ ] ; Melena [ ] ; Hematochezia [ ] ; Heartburn [ ] ; Abdominal pain [ ] ; Constipation [ ] ; Diarrhea [ ] ; BRBPR [ ]     GU: Hematuria [ ] ; Dysuria [ ] ; Nocturia [ ]   Vascular: Pain in legs with walking [ ] ; Pain in feet with lying flat [ ] ; Non-healing sores [ ] ; Stroke [ ] ; TIA [ ] ; Slurred speech [ ] ;    Neuro: Headaches [ ] ; Vertigo [ ] ; Seizures [ ] ; Paresthesias [ ] ;Blurred vision [ ] ; Diplopia [ ] ; Vision changes [ ]     Ortho/Skin: Arthritis [ ] ; Joint pain [ ] ; Muscle pain [ ] ; Joint swelling [ ] ; Back Pain [ ] ; Rash [ ]     Psych: Depression [ ] ; Anxiety [ ]     Heme: Bleeding problems [ ] ; Clotting disorders [ ] ; Anemia [ ]     Endocrine: Diabetes [ ] ; Thyroid dysfunction [ ]    Physical Exam/Data:   Vitals:   11/11/20 0204 11/11/20 0234 11/11/20 0353 11/11/20 0400  BP: (!) 163/69 (!) 154/78 104/80 (!) 164/85  Pulse: 70 71 63 65  Resp: (!) 22 18 18 18   Temp:      TempSrc:      SpO2: 97% 100% 91% 98%   No intake or output data in the 24 hours ending 11/11/20 0442 Last 3 Weights 03/07/2020 03/01/2020 10/27/2019  Weight (lbs) 279 lb 1.6 oz 285 lb 284 lb  Weight (kg) 126.599 kg 129.275 kg 128.822 kg     There is no height or weight on file to calculate BMI.   Remote review of records Physical exam not performed  EKG:  The EKG was personally reviewed and demonstrates: NSR (90)  with no acute ST changes from prio  Telemetry:  Telemetry was not reviewed   Relevant CV Studies:  TTE pending   Laboratory Data:  High Sensitivity Troponin:   Recent Labs  Lab 11/10/20 1545 11/10/20 1744 11/11/20 0153  TROPONINIHS 49* 51* 32*     Chemistry Recent Labs  Lab 11/10/20 1545 11/11/20 0153  NA 137  --   K 3.8  --   CL 100   --   CO2 27  --   GLUCOSE 202*  --   BUN 12  --   CREATININE 0.86 0.94  CALCIUM 9.0  --   GFRNONAA >60 >60  ANIONGAP 10  --     Recent Labs  Lab 11/10/20 1545  PROT 7.8  ALBUMIN 3.7  AST 41  ALT 38  ALKPHOS 131*  BILITOT 0.6   Hematology Recent Labs  Lab 11/10/20 1545 11/11/20 0153  WBC 10.3 10.9*  RBC 4.79 4.60  HGB 12.5 12.1  HCT 39.8 38.0  MCV 83.1 82.6  MCH 26.1 26.3  MCHC 31.4 31.8  RDW 14.2 14.5  PLT 223 244   BNP Recent Labs  Lab 11/10/20 1545  BNP 99.4    DDimer No results for input(s): DDIMER in the last 168 hours.  Radiology/Studies:  DG Chest 2 View  Result Date: 11/10/2020 CLINICAL DATA:  Chest pain for 2 weeks while laying down. EXAM: CHEST - 2 VIEW COMPARISON:  12/13/2017 FINDINGS: Cardiac silhouette top-normal in size. No mediastinal or hilar masses or evidence of adenopathy. Lungs are mildly hyperexpanded, but clear. No pleural effusion or pneumothorax. Skeletal structures are intact. IMPRESSION: No active cardiopulmonary disease. Electronically Signed   By: Amie Portlandavid  Ormond M.D.   On: 11/10/2020 14:50   Assessment and Plan:   76F with CAD, DM2, HTN, OSA, and fibromyalgia who presented with shortness of breath, cough, wheezing, abdominal and chest discomfort.  She has not followed with our system and does not have excessive cardiac history within [facility] or within [facility].  Her troponins were initially flat with a small downtrend on the third recheck.  Her BNP was not significantly elevated and her chest x-ray had no signs of fluid overload. She doesn't have a clear etiology for her sx currently, although she reports that this is similar to her prior anginal equivalent. She is being admitted to the hospital team with plans for TTE today. Cardiology will consult on her today to provide additional recommendations. Based off her presentation I do no think she needs to be tx for ACS.   For questions or updates, please contact [facility] Please  consult [email or web address] for contact info under   Signed, Linton RumpMatthew A Ceira Hoeschen, MD  11/11/2020 4:42 AM

## 2020-11-11 NOTE — Consult Note (Addendum)
Cardiology Consultation:   Patient ID: JASELYNN TAMAS MRN: [id number]; DOB: 17-Feb-1956  Admit date: 11/10/2020 Date of Consult: 11/11/2020  Primary Care Provider: Georgianne Fick, MD [facility] Cardiologist: [facility] Electrophysiologist:  None    Patient Profile:   CLEOTA PELLERITO is a 64 y.o. female with a hx of CAD, HTN, HLD, DM II, OSA and fibromyalgia who is being seen today for the evaluation of orthopnea at the request of Dr. Hanley Ben.  History of Present Illness:   Ms. Hochberg is a 64 year old morbidly obese female with past medical history of CAD, HTN, HLD, DM II, OSA and fibromyalgia.  Patient previously underwent cardiac catheterization on 02/18/2000 by Dr. Lenise Herald due to increasing chest pressure and shortness of breath.  Cardiac catheterization only revealed 20% ostial stenosis in the AV groove circumflex artery, otherwise she has essentially normal coronary arteries, EF 50% by LV gram.  She has not been seen by cardiologist since.  She was diagnosed with obstructive sleep apnea many years ago however does not use CPAP therapy.  She says she had a dog before that tend to claw into things and she was afraid her dog might mess with the machine.  She was previously on 40 mg daily of lisinopril, this was cut back to 20 mg daily.  She sees her primary care provider on a yearly basis.  She says she has largely been compliant with her blood pressure medication except for the past few days.  For the past several weeks, she has been noticing increasing episodes of orthopnea.  She breathes fine sitting up, however the moment when she tried to lay down, she feels an increased shortness of breath.  She also noticed slight increase of shortness of breath with exertion as well however does admit she does not do much strenuous activity.  She denies any recent chest discomfort.  She typically does not monitor her blood pressure at home.  She ultimately presented to  [facility] on 11/10/2020 due to increasing orthopnea symptoms.  Blood pressure on arrival was over 200.  Serial high-sensitivity troponin was obtained, 49--51--32.  Blood glucose over 200.  Covid test negative.  Metoprolol was added in the emergency room.  Cardiology was consulted for shortness of breath.  There was some suspicion that she might has anginal equivalent.   Past Medical History:  Diagnosis Date  . Coronary artery disease   . Diabetes mellitus without complication (HCC)    type 2  . Fibromyalgia   . Hypertension   . Sleep apnea    could not tolerate cpap, sleep study 10 yrs ago  . Urethral diverticulum 2015   evaluated by Dr. Wynelle Link  . Urinary incontinence     Past Surgical History:  Procedure Laterality Date  . CARDIAC CATHETERIZATION  2001   nonobstructive cad  . COLONOSCOPY  10/05/12  . CYSTOSCOPY  03/07/2020   Procedure: CYSTOSCOPY;  Surgeon: Crist Fat, MD;  Location: [facility];  Service: Urology;;  . DILATATION & CURETTAGE/HYSTEROSCOPY WITH MYOSURE N/A 09/27/2015   Procedure: DILATATION & CURETTAGE/HYSTEROSCOPY WITH MYOSURE;  Surgeon: Dara Lords, MD;  Location: [facility];  Service: Gynecology;  Laterality: N/A;  . FOOT SURGERY Left   . KNEE SURGERY Left    meniscus repair  . URETHRAL DIVERTICULUM REPAIR N/A 03/07/2020   Procedure: TRANSVAGINAL URETHRAL DIVERTICULECTOMY;  Surgeon: Crist Fat, MD;  Location: [facility];  Service: Urology;  Laterality: N/A;  Home Medications:  Prior to Admission medications   Medication Sig Start Date End Date Taking? Authorizing Provider  acetaminophen (TYLENOL) 500 MG tablet Take 1,000 mg by mouth every 6 (six) hours as needed for headache (pain).    Yes [provider]  Ascorbic Acid (VITAMIN C PO) Take 2 tablets by mouth daily.   Yes [provider]  atorvastatin (LIPITOR) 40 MG tablet Take 40 mg by mouth daily.   Yes [provider]   COLLAGEN PO Take 1 tablet by mouth daily.   Yes [provider]  insulin aspart (NOVOLOG FLEXPEN) 100 UNIT/ML FlexPen Inject 13-55 Units into the skin See admin instructions. Inject 25 units subcutaneously before breakfast, 13 units before lunch and 55 units before supper   Yes [provider]  insulin degludec (TRESIBA FLEXTOUCH) 200 UNIT/ML FlexTouch Pen Inject 125 Units into the skin daily before breakfast.   Yes [provider]  lisinopril (ZESTRIL) 20 MG tablet Take 20 mg by mouth daily.   Yes [provider]  metFORMIN (GLUCOPHAGE-XR) 500 MG 24 hr tablet Take 1,000 mg by mouth 2 (two) times daily with a meal.   Yes [provider]  Multiple Vitamin (MULTIVITAMIN WITH MINERALS) TABS tablet Take 1 tablet by mouth daily.   Yes [provider]  Multiple Vitamins-Minerals (EYE VITAMINS PO) Take 2 capsules by mouth daily.   Yes [provider]  nystatin (MYCOSTATIN/NYSTOP) powder Apply 1 application topically daily as needed (rash (skin folds)).   Yes [provider]  Semaglutide, 1 MG/DOSE, (OZEMPIC, 1 MG/DOSE,) 2 MG/1.5ML SOPN Inject 1 mg into the skin every Wednesday.   Yes [provider]  traMADol (ULTRAM) 50 MG tablet Take 1-2 tablets (50-100 mg total) by mouth every 6 (six) hours as needed for moderate pain. 03/07/20  Yes Crist FatHerrick, Benjamin W, MD  traZODone (DESYREL) 50 MG tablet Take 50 mg by mouth at bedtime as needed for sleep.    Yes [provider]  lisinopril (PRINIVIL,ZESTRIL) 40 MG tablet Take 1 tablet (40 mg total) by mouth daily. Patient not taking: Reported on 11/10/2020 02/28/17   Everlene Farrieransie, William, PA-C    Inpatient Medications: Scheduled Meds: . vitamin C  250 mg Oral Daily  . atorvastatin  40 mg Oral Daily  . enoxaparin (LOVENOX) injection  120 mg Subcutaneous BID  . insulin aspart  0-20 Units Subcutaneous TID WC  . insulin aspart  0-5 Units Subcutaneous QHS  . insulin aspart  25 Units  Subcutaneous Q breakfast   And  . insulin aspart  13 Units Subcutaneous Q lunch   And  . insulin aspart  55 Units Subcutaneous Q supper  . insulin glargine  125 Units Subcutaneous Daily  . lisinopril  20 mg Oral Daily  . metFORMIN  1,000 mg Oral BID WC  . metoprolol tartrate  25 mg Oral BID  . multivitamin  1 tablet Oral QHS  . multivitamin with minerals  1 tablet Oral Daily  . [START ON 11/13/2020] Semaglutide (1 MG/DOSE)  1 mg Subcutaneous Q Wed   Continuous Infusions:  PRN Meds: acetaminophen, nystatin, ondansetron (ZOFRAN) IV, traMADol, traZODone  Allergies:    Allergies  Allergen Reactions  . Other Anaphylaxis    Allergy to cashews, pecans, walnuts, [place] nuts (can only eat peanuts and almonds)  . Accupril [Quinapril Hcl] Hives  . Daypro [Oxaprozin] Other (See Comments)    GI upset  . Zithromax [Azithromycin] Hives    Social History:   Social History   Socioeconomic  History  . Marital status: Married    Spouse name: Stephannie Peters  . Number of children: 0  . Years of education: Not on file  . Highest education level: Not on file  Occupational History  . Not on file  Tobacco Use  . Smoking status: Never Smoker  . Smokeless tobacco: Never Used  Vaping Use  . Vaping Use: Never used  Substance and Sexual Activity  . Alcohol use: No    Alcohol/week: 0.0 standard drinks  . Drug use: No  . Sexual activity: Yes    Birth control/protection: Post-menopausal    Comment: 1st intercourse 64 yo-Fewer than 5 partners  Other Topics Concern  . Not on file  Social History Narrative   Lives with husband in one story home.  No children.   BS degree.   Customer Service at [employer].            Social Determinants of Health   Financial Resource Strain:   . Difficulty of Paying Living Expenses: Not on file  Food Insecurity:   . Worried About Programme researcher, broadcasting/film/video in the Last Year: Not on file  . Ran Out of Food in the Last Year: Not on file  Transportation Needs:     . Lack of Transportation (Medical): Not on file  . Lack of Transportation (Non-Medical): Not on file  Physical Activity:   . Days of Exercise per Week: Not on file  . Minutes of Exercise per Session: Not on file  Stress:   . Feeling of Stress : Not on file  Social Connections:   . Frequency of Communication with Friends and Family: Not on file  . Frequency of Social Gatherings with Friends and Family: Not on file  . Attends Religious Services: Not on file  . Active Member of Clubs or Organizations: Not on file  . Attends Banker Meetings: Not on file  . Marital Status: Not on file  Intimate Partner Violence:   . Fear of Current or Ex-Partner: Not on file  . Emotionally Abused: Not on file  . Physically Abused: Not on file  . Sexually Abused: Not on file    Family History:    Family History  Problem Relation Age of Onset  . Diabetes Mother   . Hypertension Mother   . Diabetes Father   . Cancer Father        Deceased, 70s--Unknown type cancer  . Cancer Maternal Aunt        Ovarian or Uterine  . Breast cancer Cousin        Maternal 1st cousin-Age 39     ROS:  Please see the history of present illness.   All other ROS reviewed and negative.     Physical Exam/Data:   Vitals:   11/11/20 0600 11/11/20 0640 11/11/20 0725 11/11/20 0800  BP: 114/76 (!) 168/63 (!) 170/69 (!) 167/72  Pulse:  69 70 64  Resp:   18 18  Temp:   98.2 F (36.8 C)   TempSrc:   Oral   SpO2:  97% 97% 97%   No intake or output data in the 24 hours ending 11/11/20 0849 Last 3 Weights 03/07/2020 03/01/2020 10/27/2019  Weight (lbs) 279 lb 1.6 oz 285 lb 284 lb  Weight (kg) 126.599 kg 129.275 kg 128.822 kg     There is no height or weight on file to calculate BMI.  General:  Well nourished, well developed, in no acute distress HEENT: normal Lymph: no  adenopathy Neck: no JVD Endocrine:  No thryomegaly Vascular: No carotid bruits; FA pulses 2+ bilaterally without bruits  Cardiac:   normal S1, S2; RRR; no murmur  Lungs:  clear to auscultation bilaterally, no wheezing, rhonchi or rales  Abd: soft, nontender, no hepatomegaly  Ext: no edema Musculoskeletal:  No deformities, BUE and BLE strength normal and equal Skin: warm and dry  Neuro:  CNs 2-12 intact, no focal abnormalities noted Psych:  Normal affect   EKG:  The EKG was personally reviewed and demonstrates: Normal sinus rhythm with nonspecific T wave changes. Telemetry:  Telemetry was personally reviewed and demonstrates: Normal sinus rhythm without significant ST-T wave changes.  Relevant CV Studies:  Cath 02/18/2000 DESCRIPTION OF PROCEDURE:  After giving informed written consent, the patient brought to the cardiac catheterization laboratory, where right and left groins were shaved, prepped and draped in usual sterile fashion.  ECG monitoring was established.  Using modified Seldinger technique, a #6-French arterial sheath wasinserted into the right femoral artery.  A 6-French diagnostic catheter was thenused to perform diagnostic angiography.  This revealed a short, large left main with no significant disease.  LAD was a large vessel, which coursed to the apex And gave rise to three diagonal branches.  LAD had no significant disease.  First, seconds and third diagonal branches had no significant disease.  The A-V groove circumflex was a large vessel, coursed in the A-V groove and gave rise to three obtuse marginal branches.  The A-V groove circumflex had a 20% ostial stenosis.  First OM was a large vessel, which bifurcates distally and has no significant disease.  Second and third marginals were medium size vessels with no Significant disease.  The right coronary artery was a medium size vessel, which was Dominant and gave rise to both PDA, as well as, a posterolateral branch.  The RCA, PDA, and posterolateral branch had no significant disease.  Left ventriculogram reveals a preserved EF calculated at 50%.   There was no significant MR.  HEMODYNAMICS:  Systemic arterial pressure 131/86, LV systemic pressure 136/7, LVEDP of 14.  IMPRESSION: 1. Essentially normal coronary arteries. 2. Low normal LV systolic function.  Laboratory Data:  High Sensitivity Troponin:   Recent Labs  Lab 11/10/20 1545 11/10/20 1744 11/11/20 0153  TROPONINIHS 49* 51* 32*     Chemistry Recent Labs  Lab 11/10/20 1545 11/11/20 0153  NA 137  --   K 3.8  --   CL 100  --   CO2 27  --   GLUCOSE 202*  --   BUN 12  --   CREATININE 0.86 0.94  CALCIUM 9.0  --   GFRNONAA >60 >60  ANIONGAP 10  --     Recent Labs  Lab 11/10/20 1545  PROT 7.8  ALBUMIN 3.7  AST 41  ALT 38  ALKPHOS 131*  BILITOT 0.6   Hematology Recent Labs  Lab 11/10/20 1545 11/11/20 0153  WBC 10.3 10.9*  RBC 4.79 4.60  HGB 12.5 12.1  HCT 39.8 38.0  MCV 83.1 82.6  MCH 26.1 26.3  MCHC 31.4 31.8  RDW 14.2 14.5  PLT 223 244   BNP Recent Labs  Lab 11/10/20 1545  BNP 99.4    DDimer No results for input(s): DDIMER in the last 168 hours.   Radiology/Studies:  DG Chest 2 View  Result Date: 11/10/2020 CLINICAL DATA:  Chest pain for 2 weeks while laying down. EXAM: CHEST - 2 VIEW COMPARISON:  12/13/2017 FINDINGS: Cardiac silhouette top-normal in size.  No mediastinal or hilar masses or evidence of adenopathy. Lungs are mildly hyperexpanded, but clear. No pleural effusion or pneumothorax. Skeletal structures are intact. IMPRESSION: No active cardiopulmonary disease. Electronically Signed   By: Amie Portland M.D.   On: 11/10/2020 14:50     Assessment and Plan:   1. Orthopnea:   -Patient denies any recent chest pain, but noticed slight increase of shortness of breath with the exertion.  The main complaint that led to this admission was increasing orthopnea episodes that make her sleep upright at home in the past few weeks.  -Blood pressure was over 200 on arrival.  She is noncompliant with CPAP therapy.  BNP was 99.4, however  patient is morbidly obese, therefore this could be falsely low.  -I suspect her symptoms could be related to hypertensive urgency and the noncompliance with CPAP therapy.  She says she did not have any orthopnea last night while in the ED.  Unclear if she had some mild pulmonary edema related to hypertensive urgency at home prior to coming in.  -High-sensitivity serial troponin is inconsistent with ACS.  Focus on treating high blood pressure, consider switch metoprolol tartrate to carvedilol.  Increase lisinopril if needed.  Systolic blood pressure goal 110s to 130s.  -Obtain echocardiogram.  Unless echocardiogram is severely abnormal, may consider outpatient 2-day Myoview  2. Hypertensive urgency  -She is on lisinopril 20 mg daily.  Metoprolol was added during this admission.  Systolic blood pressure was over 200 on arrival.  -Consider switch metoprolol tartrate to carvedilol for better blood pressure control.  3. Obstructive sleep apnea not on CPAP: Patient says she was diagnosed with obstructive sleep apnea more than 5 years ago however did not wear the CPAP machine as she had a dog back then that tend to break things.  She will need a repeat sleep study and to be placed on CPAP therapy again.  4. CAD: Her symptom is likely more related to hypertensive urgency rather than anginal equivalent.  Unless echocardiogram is severely abnormal, consider outpatient 2-day Myoview.  Will discuss with MD  5. Hyperlipidemia: Continue home Lipitor  6. DM2: Sliding scale insulin  TIMI Risk Score for Unstable Angina or Non-ST Elevation MI:   The patient's TIMI risk score is 2, which indicates a 8% risk of all cause mortality, new or recurrent myocardial infarction or need for urgent revascularization in the next 14 days.  For questions or updates, please contact [facility] Please consult [email or web address] for contact info under   Signed, Azalee Course, PA  11/11/2020 8:49 AM As above, patient seen and  examined.  Briefly she is a 64 year old female with past medical history of hypertension, hyperlipidemia, diabetes mellitus, nonobstructive coronary disease, obstructive sleep apnea, fibromyalgia for evaluation of dyspnea.  Patient states that for the past 2 weeks she has developed dyspnea when she lies flat which wakes her up at night.  When she sits up her symptoms improved.  She denies exertional dyspnea to me.  She has occasionally had edema in her left lower extremity since surgery last April.  However this has not worsened recently.  She denies chest pain or syncope.  No hemoptysis.  She was admitted and cardiology now asked to evaluate.  Initial blood pressure 239/99.  Patient states she occasionally misses her blood pressure medications.  Chest x-ray unremarkable.  Troponin 49, 51 and 32.  BUN 12 and creatinine 0.86.  Hemoglobin 12.5.  Electrocardiogram shows sinus rhythm, prolonged QT interval and nonspecific ST changes.  1  orthopnea/dyspnea-patient describes orthopnea although she does not describe significant dyspnea on exertion to me.  BNP not markedly elevated but also may be falsely low due to obesity.  I will treat with Lasix 40 mg daily.  Follow renal function.  Check echocardiogram for LV function.  2 hypertensive urgency-patient's blood pressure markedly elevated at time of admission.  Continue lisinopril and metoprolol.  We will advance regimen as needed.  I also discussed the importance of complying with her medical regimen at home.  3 mildly elevated troponin-minimally elevated and patient denies chest pain.  Not consistent with acute coronary syndrome.  4 history of obstructive sleep apnea-she is not using her CPAP.  I discussed the importance of complying with this following discharge.  5 morbid obesity-we discussed the importance of diet, exercise and weight loss.  6 hyperlipidemia-continue statin.  Olga Millers, MD

## 2020-11-11 NOTE — Progress Notes (Signed)
  Echocardiogram 2D Echocardiogram has been performed.  Cheryl Mckee 11/11/2020, 3:55 PM

## 2020-11-11 NOTE — ED Notes (Signed)
Pt rounding complete. Pt resting comfortably with bed in lowest position and side rails up x2. Vitals updated. All needs met at this time. Will continue to monitor.  

## 2020-11-11 NOTE — ED Notes (Signed)
Pt ambulates without assistance to and from bathroom with steady gait.

## 2020-11-11 NOTE — ED Notes (Signed)
Pt report given to Corine Shelter, Charity fundraiser.  Pt SBAR information covered at this time.  No additional questions were asked.  NADN.

## 2020-11-11 NOTE — Plan of Care (Signed)
  Problem: Education: Goal: Knowledge of General Education information will improve Description: Including pain rating scale, medication(s)/side effects and non-pharmacologic comfort measures Outcome: Progressing   Problem: Health Behavior/Discharge Planning: Goal: Ability to manage health-related needs will improve Outcome: Progressing   Problem: Clinical Measurements: Goal: Ability to maintain clinical measurements within normal limits will improve Outcome: Progressing Goal: Respiratory complications will improve Outcome: Progressing Goal: Cardiovascular complication will be avoided Outcome: Progressing   Problem: Activity: Goal: Risk for activity intolerance will decrease Outcome: Progressing   Problem: Coping: Goal: Level of anxiety will decrease Outcome: Progressing   Problem: Pain Managment: Goal: General experience of comfort will improve Outcome: Progressing   Problem: Safety: Goal: Ability to remain free from injury will improve Outcome: Progressing   

## 2020-11-11 NOTE — Progress Notes (Signed)
Patient ID: Cheryl Mckee, female   DOB: 11/15/1956, 64 y.o.   MRN: [id number]  PROGRESS NOTE    CINDEL DAUGHERTY  JZP:[id number] DOB: December 24, 1955 DOA: 11/10/2020 PCP: Georgianne Fick, MD   Brief Narrative:  64 y.o.femalewith medical history significant ofcoronary artery disease, diabetes, hypertension, obstructive sleep apnea, fibromyalgia, who presented with significant shortness of breath with activity and when laying down flat. Associated with cough and some wheezing. Patient also felt some chest discomfort.  On presentation, she had minimal elevation of her troponins without significant EKG change.  Cardiology was consulted and recommended observation to rule out MI/CHF.  Troponin was 49 and then 51; BNP was 99; chest x-ray was negative for acute findings.   Assessment & Plan:   Exertional dyspnea Minimally elevated troponin in a patient with history of CAD Hyperlipidemia -Presented with exertional dyspnea and minimally elevated troponin of 49 and then 51.  EKG was nonischemic.  Cardiology has been consulted.  Symptoms are improving.  Echo pending. -Continue statin  Hypertension -Blood pressure on the higher side.  Continue lisinopril.  Metoprolol has been started which will be continued.  Obstructive sleep apnea -Currently not on CPAP at home.  Will need repeat sleep study as an outpatient and subsequent CPAP  Morbid obesity -will need weight loss and outpatient follow-up  Diabetes mellitus type II uncontrolled with hyperglycemia -A1c of 9.  Carb modified diet.  Continue home regimen.  Outpatient follow-up with PCP    DVT prophylaxis: Lovenox Code Status: Full Family Communication: None at bedside disposition Plan: Status is: Observation  The patient remains OBS appropriate and will d/c before 2 midnights.  Will be discharged home later today if cleared by cardiology otherwise she might have to stay 1 more night.  Dispo: The patient is from: Home               Anticipated d/c is to: Home              Anticipated d/c date is: 1 day              Patient currently is not medically stable to d/c.  Consultants: Cardiology  Procedures: Echo pending  Antimicrobials: None   Subjective: Patient seen and examined at bedside.  She feels better and is hoping to go home today.  Denies any current worsening shortness breath or chest pain.  No overnight fever or vomiting reported.   Objective: Vitals:   11/11/20 1000 11/11/20 1030 11/11/20 1100 11/11/20 1200  BP: (!) 174/75 (!) 183/85 (!) 189/71 (!) 179/87  Pulse: 68 75 69 65  Resp: 20 15 (!) 30 18  Temp:      TempSrc:      SpO2: 98% 97% 97% 98%   No intake or output data in the 24 hours ending 11/11/20 1318 Filed Weights    Examination: General: Pt is alert, awake, not in acute distress Cardiovascular: rate controlled, S1/S2 + Respiratory: bilateral decreased breath sounds at bases Abdominal: Soft, morbidly obese, NT, ND, bowel sounds + Extremities: Trace lower extremity edema; no cyanosis    Data Reviewed: I have personally reviewed following labs and imaging studies  CBC: Recent Labs  Lab 11/10/20 1545 11/11/20 0153  WBC 10.3 10.9*  HGB 12.5 12.1  HCT 39.8 38.0  MCV 83.1 82.6  PLT 223 244   Basic Metabolic Panel: Recent Labs  Lab 11/10/20 1545 11/11/20 0153  NA 137  --   K 3.8  --   CL 100  --  CO2 27  --   GLUCOSE 202*  --   BUN 12  --   CREATININE 0.86 0.94  CALCIUM 9.0  --    GFR: CrCl cannot be calculated (Unknown ideal weight.). Liver Function Tests: Recent Labs  Lab 11/10/20 1545  AST 41  ALT 38  ALKPHOS 131*  BILITOT 0.6  PROT 7.8  ALBUMIN 3.7   No results for input(s): LIPASE, AMYLASE in the last 168 hours. No results for input(s): AMMONIA in the last 168 hours. Coagulation Profile: No results for input(s): INR, PROTIME in the last 168 hours. Cardiac Enzymes: No results for input(s): CKTOTAL, CKMB, CKMBINDEX, TROPONINI in the last 168  hours. BNP (last 3 results) No results for input(s): PROBNP in the last 8760 hours. HbA1C: Recent Labs    11/11/20 0153  HGBA1C 9.0*   CBG: Recent Labs  Lab 11/11/20 0135 11/11/20 0835 11/11/20 1044 11/11/20 1153  GLUCAP 201* 157* 138* 114*   Lipid Profile: Recent Labs    11/11/20 0153  CHOL 140  HDL 37*  LDLCALC 91  TRIG 59  CHOLHDL 3.8   Thyroid Function Tests: No results for input(s): TSH, T4TOTAL, FREET4, T3FREE, THYROIDAB in the last 72 hours. Anemia Panel: No results for input(s): VITAMINB12, FOLATE, FERRITIN, TIBC, IRON, RETICCTPCT in the last 72 hours. Sepsis Labs: Recent Labs  Lab 11/10/20 1545 11/10/20 1744  LATICACIDVEN 1.3 1.1    Recent Results (from the past 240 hour(s))  Resp Panel by RT-PCR (Flu A&B, Covid) Nasopharyngeal Swab     Status: None   Collection Time: 11/10/20  7:02 PM   Specimen: Nasopharyngeal Swab; Nasopharyngeal(NP) swabs in vial transport medium  Result Value Ref Range Status   SARS Coronavirus 2 by RT PCR NEGATIVE NEGATIVE Final    Comment: (NOTE) SARS-CoV-2 target nucleic acids are NOT DETECTED.  The SARS-CoV-2 RNA is generally detectable in upper respiratory specimens during the acute phase of infection. The lowest concentration of SARS-CoV-2 viral copies this assay can detect is 138 copies/mL. A negative result does not preclude SARS-Cov-2 infection and should not be used as the sole basis for treatment or other patient management decisions. A negative result may occur with  improper specimen collection/handling, submission of specimen other than nasopharyngeal swab, presence of viral mutation(s) within the areas targeted by this assay, and inadequate number of viral copies(<138 copies/mL). A negative result must be combined with clinical observations, patient history, and epidemiological information. The expected result is Negative.  Fact Sheet for Patients:  BloggerCourse.com  Fact Sheet for  Healthcare Providers:  SeriousBroker.it  This test is no t yet approved or cleared by the [place] FDA and  has been authorized for detection and/or diagnosis of SARS-CoV-2 by FDA under an Emergency Use Authorization (EUA). This EUA will remain  in effect (meaning this test can be used) for the duration of the COVID-19 declaration under Section 564(b)(1) of the Act, 21 U.S.C.section 360bbb-3(b)(1), unless the authorization is terminated  or revoked sooner.       Influenza A by PCR NEGATIVE NEGATIVE Final   Influenza B by PCR NEGATIVE NEGATIVE Final    Comment: (NOTE) The Xpert Xpress SARS-CoV-2/FLU/RSV plus assay is intended as an aid in the diagnosis of influenza from Nasopharyngeal swab specimens and should not be used as a sole basis for treatment. Nasal washings and aspirates are unacceptable for Xpert Xpress SARS-CoV-2/FLU/RSV testing.  Fact Sheet for Patients: BloggerCourse.com  Fact Sheet for Healthcare Providers: SeriousBroker.it  This test is not yet approved or cleared  by the [place] and has been authorized for detection and/or diagnosis of SARS-CoV-2 by FDA under an Emergency Use Authorization (EUA). This EUA will remain in effect (meaning this test can be used) for the duration of the COVID-19 declaration under Section 564(b)(1) of the Act, 21 U.S.C. section 360bbb-3(b)(1), unless the authorization is terminated or revoked.  Performed at [facility], [address], [place], [place] [address]          Radiology Studies: DG Chest 2 View  Result Date: 11/10/2020 CLINICAL DATA:  Chest pain for 2 weeks while laying down. EXAM: CHEST - 2 VIEW COMPARISON:  12/13/2017 FINDINGS: Cardiac silhouette top-normal in size. No mediastinal or hilar masses or evidence of adenopathy. Lungs are mildly hyperexpanded, but clear. No pleural effusion or pneumothorax.  Skeletal structures are intact. IMPRESSION: No active cardiopulmonary disease. Electronically Signed   By: Amie Portland M.D.   On: 11/10/2020 14:50        Scheduled Meds: . vitamin C  250 mg Oral Daily  . atorvastatin  40 mg Oral Daily  . enoxaparin (LOVENOX) injection  120 mg Subcutaneous BID  . furosemide  40 mg Oral Daily  . insulin aspart  0-20 Units Subcutaneous TID WC  . insulin aspart  0-5 Units Subcutaneous QHS  . insulin aspart  25 Units Subcutaneous Q breakfast   And  . insulin aspart  13 Units Subcutaneous Q lunch   And  . insulin aspart  55 Units Subcutaneous Q supper  . insulin glargine  125 Units Subcutaneous Daily  . lisinopril  20 mg Oral Daily  . metFORMIN  1,000 mg Oral BID WC  . metoprolol tartrate  25 mg Oral BID  . multivitamin  1 tablet Oral QHS  . multivitamin with minerals  1 tablet Oral Daily  . [START ON 11/13/2020] Semaglutide (1 MG/DOSE)  1 mg Subcutaneous Q Wed   Continuous Infusions:        Glade Lloyd, MD [facility] 11/11/2020, 1:18 PM

## 2020-11-12 DIAGNOSIS — E119 Type 2 diabetes mellitus without complications: Secondary | ICD-10-CM

## 2020-11-12 DIAGNOSIS — I10 Essential (primary) hypertension: Secondary | ICD-10-CM | POA: Diagnosis not present

## 2020-11-12 DIAGNOSIS — R778 Other specified abnormalities of plasma proteins: Secondary | ICD-10-CM

## 2020-11-12 DIAGNOSIS — R0600 Dyspnea, unspecified: Secondary | ICD-10-CM | POA: Diagnosis not present

## 2020-11-12 LAB — BASIC METABOLIC PANEL
Anion gap: 12 (ref 5–15)
BUN: 11 mg/dL (ref 8–23)
CO2: 25 mmol/L (ref 22–32)
Calcium: 9.1 mg/dL (ref 8.9–10.3)
Chloride: 102 mmol/L (ref 98–111)
Creatinine, Ser: 0.91 mg/dL (ref 0.44–1.00)
GFR, Estimated: >60 mL/min (ref >60)
Glucose, Bld: 60 mg/dL — ABNORMAL LOW (ref 70–99)
Potassium: 3.4 mmol/L — ABNORMAL LOW (ref 3.5–5.1)
Sodium: 139 mmol/L (ref 135–145)

## 2020-11-12 LAB — GLUCOSE, CAPILLARY
Glucose-Capillary: 53 mg/dL — ABNORMAL LOW (ref 70–99)
Glucose-Capillary: 99 mg/dL (ref 70–99)

## 2020-11-12 MED ORDER — LISINOPRIL 20 MG PO TABS
20.0000 mg | ORAL_TABLET | Freq: Once | ORAL | Status: AC
  Administered 2020-11-12: 20 mg via ORAL
  Filled 2020-11-12: qty 1

## 2020-11-12 MED ORDER — POTASSIUM CHLORIDE CRYS ER 20 MEQ PO TBCR
40.0000 meq | EXTENDED_RELEASE_TABLET | Freq: Once | ORAL | Status: AC
  Administered 2020-11-12: 40 meq via ORAL
  Filled 2020-11-12: qty 2

## 2020-11-12 MED ORDER — POTASSIUM CHLORIDE CRYS ER 20 MEQ PO TBCR
20.0000 meq | EXTENDED_RELEASE_TABLET | Freq: Every day | ORAL | 0 refills | Status: DC
Start: 2020-11-12 — End: 2023-12-25

## 2020-11-12 MED ORDER — LOPERAMIDE HCL 2 MG PO CAPS: 2.0000 mg | ORAL_CAPSULE | Freq: Four times a day (QID) | ORAL | 0 refills | Status: AC | PRN

## 2020-11-12 MED ORDER — LISINOPRIL 20 MG PO TABS: 40.0000 mg | ORAL_TABLET | Freq: Every day | ORAL | Status: DC

## 2020-11-12 MED ORDER — METOPROLOL TARTRATE 25 MG PO TABS
25.0000 mg | ORAL_TABLET | Freq: Two times a day (BID) | ORAL | 0 refills | Status: DC
Start: 2020-11-12 — End: 2023-12-25

## 2020-11-12 MED ORDER — LISINOPRIL 40 MG PO TABS
40.0000 mg | ORAL_TABLET | Freq: Every day | ORAL | 0 refills | Status: DC
Start: 2020-11-12 — End: 2023-12-25

## 2020-11-12 MED ORDER — FUROSEMIDE 40 MG PO TABS
40.0000 mg | ORAL_TABLET | Freq: Every day | ORAL | 0 refills | Status: DC
Start: 2020-11-13 — End: 2023-12-25

## 2020-11-12 NOTE — Progress Notes (Addendum)
Progress Note  Patient Name: Cheryl Mckee Date of Encounter: 11/12/2020  [facility] Cardiologist: Olga Millers, MD   Subjective   Denies any CP or SOB.   Inpatient Medications    Scheduled Meds: . vitamin C  250 mg Oral Daily  . atorvastatin  40 mg Oral Daily  . enoxaparin (LOVENOX) injection  120 mg Subcutaneous BID  . furosemide  40 mg Oral Daily  . insulin aspart  0-20 Units Subcutaneous TID WC  . insulin aspart  0-5 Units Subcutaneous QHS  . lisinopril  20 mg Oral Once  . [START ON 11/13/2020] lisinopril  40 mg Oral Daily  . metoprolol tartrate  25 mg Oral BID  . multivitamin  1 tablet Oral QHS  . multivitamin with minerals  1 tablet Oral Daily  . potassium chloride  40 mEq Oral Once  . [START ON 11/13/2020] Semaglutide (1 MG/DOSE)  1 mg Subcutaneous Q Wed   Continuous Infusions:  PRN Meds: acetaminophen, loperamide, nystatin, ondansetron (ZOFRAN) IV, traMADol, traZODone   Vital Signs    Vitals:   11/12/20 0024 11/12/20 0428 11/12/20 0800 11/12/20 0820  BP: (!) 163/63 (!) 157/60 (!) 147/78 (!) 147/78  Pulse: 68 71  77  Resp: 16 16    Temp: 98.1 F (36.7 C) 98.1 F (36.7 C)    TempSrc: Oral     SpO2: 99% 96%  100%  Weight:      Height:       No intake or output data in the 24 hours ending 11/12/20 0927 Last 3 Weights 11/11/2020 03/07/2020 03/01/2020  Weight (lbs) 289 lb 3.9 oz 279 lb 1.6 oz 285 lb  Weight (kg) 131.2 kg 126.599 kg 129.275 kg      Telemetry    NSR without significant ventricular ectopy - Personally Reviewed  ECG    NSR with nonspecific T wave changes - Personally Reviewed  Physical Exam   GEN: No acute distress.   Neck: No JVD Cardiac: RRR, no murmurs, rubs, or gallops.  Respiratory: Clear to auscultation bilaterally. GI: Soft, nontender, non-distended  MS: No edema; No deformity. Neuro:  Nonfocal  Psych: Normal affect   Labs    High Sensitivity Troponin:   Recent Labs  Lab 11/10/20 1545 11/10/20 1744  11/11/20 0153  TROPONINIHS 49* 51* 32*      Chemistry Recent Labs  Lab 11/10/20 1545 11/11/20 0153 11/12/20 0451  NA 137  --  139  K 3.8  --  3.4*  CL 100  --  102  CO2 27  --  25  GLUCOSE 202*  --  60*  BUN 12  --  11  CREATININE 0.86 0.94 0.91  CALCIUM 9.0  --  9.1  PROT 7.8  --   --   ALBUMIN 3.7  --   --   AST 41  --   --   ALT 38  --   --   ALKPHOS 131*  --   --   BILITOT 0.6  --   --   GFRNONAA >60 >60 >60  ANIONGAP 10  --  12     Hematology Recent Labs  Lab 11/10/20 1545 11/11/20 0153  WBC 10.3 10.9*  RBC 4.79 4.60  HGB 12.5 12.1  HCT 39.8 38.0  MCV 83.1 82.6  MCH 26.1 26.3  MCHC 31.4 31.8  RDW 14.2 14.5  PLT 223 244    BNP Recent Labs  Lab 11/10/20 1545  BNP 99.4     DDimer No  results for input(s): DDIMER in the last 168 hours.   Radiology    DG Chest 2 View  Result Date: 11/10/2020 CLINICAL DATA:  Chest pain for 2 weeks while laying down. EXAM: CHEST - 2 VIEW COMPARISON:  12/13/2017 FINDINGS: Cardiac silhouette top-normal in size. No mediastinal or hilar masses or evidence of adenopathy. Lungs are mildly hyperexpanded, but clear. No pleural effusion or pneumothorax. Skeletal structures are intact. IMPRESSION: No active cardiopulmonary disease. Electronically Signed   By: Amie Portlandavid  Ormond M.D.   On: 11/10/2020 14:50   ECHOCARDIOGRAM COMPLETE  Result Date: 11/11/2020    ECHOCARDIOGRAM REPORT   Patient Name:   Cheryl Mckee Date of Exam: 11/11/2020 Medical Rec #:  [id number]       Height:       69.0 in Accession #:    [id number]      Weight:       279.1 lb Date of Birth:  05/12/1956        BSA:          2.380 m Patient Age:    64 years        BP:           178/69 mmHg Patient Gender: F               HR:           73 bpm. Exam Location:  Inpatient Procedure: 2D Echo Indications:    786.09 dyspnea  History:        Patient has no prior history of Echocardiogram examinations.                 CAD; Risk Factors:Diabetes and Hypertension.  Sonographer:     Celene SkeenVijay Shankar RDCS (AE) Referring Phys: [id number] [facility] Jerelyn CharlesL GARBA  Sonographer Comments: Patient is morbidly obese, suboptimal apical window and no subcostal window. Image acquisition challenging due to patient body habitus. IMPRESSIONS  1. Difficult study due to patient body habitus. EF overall normal 60-65%. Diastolic evaluation was non-diagnostic due to poor quality images.  2. Left ventricular ejection fraction, by estimation, is 60 to 65%. The left ventricle has normal function. The left ventricle has no regional wall motion abnormalities. Left ventricular diastolic function could not be evaluated.  3. Right ventricular systolic function is normal. The right ventricular size is normal. Tricuspid regurgitation signal is inadequate for assessing PA pressure.  4. The mitral valve is degenerative. No evidence of mitral valve regurgitation. No evidence of mitral stenosis.  5. The aortic valve is grossly normal. Aortic valve regurgitation is not visualized. No aortic stenosis is present. FINDINGS  Left Ventricle: Left ventricular ejection fraction, by estimation, is 60 to 65%. The left ventricle has normal function. The left ventricle has no regional wall motion abnormalities. The left ventricular internal cavity size was normal in size. There is  no left ventricular hypertrophy. Left ventricular diastolic function could not be evaluated due to nondiagnostic images. Left ventricular diastolic function could not be evaluated. Right Ventricle: The right ventricular size is normal. No increase in right ventricular wall thickness. Right ventricular systolic function is normal. Tricuspid regurgitation signal is inadequate for assessing PA pressure. Left Atrium: Left atrial size was not well visualized. Right Atrium: Right atrial size was not well visualized. Pericardium: Trivial pericardial effusion is present. Presence of pericardial fat pad. Mitral Valve: The mitral valve is degenerative in appearance. Mild to moderate  mitral annular calcification. No evidence of mitral valve regurgitation. No evidence of mitral valve stenosis. Tricuspid Valve:  The tricuspid valve is grossly normal. Tricuspid valve regurgitation is not demonstrated. No evidence of tricuspid stenosis. Aortic Valve: The aortic valve is grossly normal. Aortic valve regurgitation is not visualized. No aortic stenosis is present. Pulmonic Valve: The pulmonic valve was grossly normal. Pulmonic valve regurgitation is not visualized. No evidence of pulmonic stenosis. Aorta: The aortic root is normal in size and structure. Venous: The inferior vena cava was not well visualized. IAS/Shunts: The interatrial septum was not well visualized.  LEFT VENTRICLE PLAX 2D LVIDd:         3.80 cm  Diastology LVIDs:         2.50 cm  LV e' medial:    6.42 cm/s LV PW:         1.20 cm  LV E/e' medial:  15.2 LV IVS:        1.00 cm  LV e' lateral:   4.90 cm/s LVOT diam:     1.90 cm  LV E/e' lateral: 19.9 LV SV:         52 LV SV Index:   22 LVOT Area:     2.84 cm  LEFT ATRIUM         Index LA diam:    4.10 cm 1.72 cm/m  AORTIC VALVE LVOT Vmax:   76.40 cm/s LVOT Vmean:  58.800 cm/s LVOT VTI:    0.182 m  AORTA Ao Root diam: 2.70 cm MITRAL VALVE MV Area (PHT): 3.08 cm    SHUNTS MV Decel Time: 246 msec    Systemic VTI:  0.18 m MV E velocity: 97.30 cm/s  Systemic Diam: 1.90 cm MV A velocity: 87.80 cm/s MV E/A ratio:  1.11 Lennie Odor MD Electronically signed by Lennie Odor MD Signature Date/Time: 11/11/2020/4:35:13 PM    Final     Cardiac Studies   Echo 11/11/2020 1. Difficult study due to patient body habitus. EF overall normal 60-65%.  Diastolic evaluation was non-diagnostic due to poor quality images.  2. Left ventricular ejection fraction, by estimation, is 60 to 65%. The  left ventricle has normal function. The left ventricle has no regional  wall motion abnormalities. Left ventricular diastolic function could not  be evaluated.  3. Right ventricular systolic function is  normal. The right ventricular  size is normal. Tricuspid regurgitation signal is inadequate for assessing  PA pressure.  4. The mitral valve is degenerative. No evidence of mitral valve  regurgitation. No evidence of mitral stenosis.  5. The aortic valve is grossly normal. Aortic valve regurgitation is not  visualized. No aortic stenosis is present.   Patient Profile     64 y.o. female with a hx of CAD, HTN, HLD, DM II, OSA and fibromyalgia who is being seen today for the evaluation of orthopnea   Assessment & Plan    1. Orthopnea:              -Patient denies any recent chest pain, but noticed slight increase of shortness of breath with the exertion.  The main complaint that led to this admission was increasing orthopnea episodes that make her sleep upright at home in the past few weeks.             -Blood pressure was over 200 on arrival.  She is noncompliant with CPAP therapy.  BNP was 99.4, however patient is morbidly obese, therefore this could be falsely low.             -I suspect her symptoms could be related to hypertensive  urgency and the noncompliance with CPAP therapy.  She says she did not have any orthopnea last night while in the ED.  Unclear if she had some mild pulmonary edema related to hypertensive urgency at home prior to coming in.             -High-sensitivity serial troponin is inconsistent with ACS.  Focus on treating high blood pressure.  Systolic blood pressure goal 110s to 130s.             -Echocardiogram showed normal EF. Continue 40mg  daily lasix, renal function stable. Will need 1 week BMET post discharge.   2. Hypertensive urgency             -She is on lisinopril 20 mg daily.  Metoprolol was added during this admission.  Systolic blood pressure was over 200 on arrival.             - SBP remain uncontrolled, will give additional 20mg  lisinopril in the morning and switch tomorrow's dose to 40mg  daily. Consider switch her metoprolol to coreg 6.25mg  BID.    3. Obstructive sleep apnea not on CPAP: Patient says she was diagnosed with obstructive sleep apnea more than 5 years ago however did not wear the CPAP machine as she had a dog back then that tend to break things.  She will need a repeat sleep study and to be placed on CPAP therapy again.  4. CAD: Her symptom is likely more related to hypertensive urgency rather than anginal equivalent.  EF normal on echo. If symptom persistent on follow up, may consider outpatient 2 day myoview  5. Hyperlipidemia: Continue home Lipitor  6. DM2: Sliding scale insulin  For questions or updates, please contact [facility] Please consult [email or web address] for contact info under   Signed, , PA  11/12/2020, 9:27 AM   As above, patient seen and examined.  She denies dyspnea or chest pain this morning.  Her echocardiogram shows preserved LV function.  Presentation likely consistent with hypertensive urgency.  Her blood pressure is improving but still not controlled.  We will increase lisinopril to 40 mg daily and continue metoprolol.  If blood pressure continues to run high will consider changing metoprolol to carvedilol.  Continue Lasix 40 mg daily.  Could also add additional medications as needed.  Troponins minimally elevated but not consistent with acute coronary syndrome.  She will need reassessment of her obstructive sleep apnea following discharge.  She will follow up with an APP from our service 1 week (check bmet at that time) following discharge and me in 3 months.  Cardiology will sign off.  Please call with questions. 

## 2020-11-25 ENCOUNTER — Encounter: Payer: Self-pay | Admitting: General Practice

## 2020-12-18 ENCOUNTER — Encounter: Payer: 59 | Admitting: Obstetrics and Gynecology

## 2020-12-19 ENCOUNTER — Encounter: Payer: Self-pay | Admitting: General Practice

## 2021-05-07 ENCOUNTER — Encounter (INDEPENDENT_AMBULATORY_CARE_PROVIDER_SITE_OTHER): Payer: 59 | Admitting: Ophthalmology

## 2021-07-16 ENCOUNTER — Encounter (INDEPENDENT_AMBULATORY_CARE_PROVIDER_SITE_OTHER): Payer: 59 | Admitting: Ophthalmology

## 2021-07-31 ENCOUNTER — Encounter (INDEPENDENT_AMBULATORY_CARE_PROVIDER_SITE_OTHER): Payer: 59 | Admitting: Ophthalmology

## 2021-08-28 ENCOUNTER — Encounter (INDEPENDENT_AMBULATORY_CARE_PROVIDER_SITE_OTHER): Payer: 59 | Admitting: Ophthalmology

## 2021-09-24 ENCOUNTER — Encounter (INDEPENDENT_AMBULATORY_CARE_PROVIDER_SITE_OTHER): Payer: 59 | Admitting: Ophthalmology

## 2021-09-29 ENCOUNTER — Encounter (INDEPENDENT_AMBULATORY_CARE_PROVIDER_SITE_OTHER): Payer: 59 | Admitting: Ophthalmology

## 2021-09-29 ENCOUNTER — Encounter (INDEPENDENT_AMBULATORY_CARE_PROVIDER_SITE_OTHER): Payer: Self-pay

## 2022-03-18 ENCOUNTER — Ambulatory Visit (INDEPENDENT_AMBULATORY_CARE_PROVIDER_SITE_OTHER): Payer: 59

## 2022-03-18 ENCOUNTER — Ambulatory Visit: Payer: 59 | Admitting: Podiatry

## 2022-03-18 DIAGNOSIS — E119 Type 2 diabetes mellitus without complications: Secondary | ICD-10-CM | POA: Diagnosis not present

## 2022-03-18 DIAGNOSIS — B351 Tinea unguium: Secondary | ICD-10-CM | POA: Diagnosis not present

## 2022-03-18 DIAGNOSIS — L97524 Non-pressure chronic ulcer of other part of left foot with necrosis of bone: Secondary | ICD-10-CM

## 2022-03-18 DIAGNOSIS — M79675 Pain in left toe(s): Secondary | ICD-10-CM

## 2022-03-18 DIAGNOSIS — M79674 Pain in right toe(s): Secondary | ICD-10-CM

## 2022-03-18 MED ORDER — DOXYCYCLINE HYCLATE 100 MG PO TABS: 100.0000 mg | ORAL_TABLET | Freq: Two times a day (BID) | ORAL | 0 refills | Status: DC

## 2022-03-18 NOTE — Addendum Note (Signed)
Addended by: Louann Sjogren R on: 03/18/2022 02:20 PM ? ? Modules accepted: Orders ? ?

## 2022-03-18 NOTE — Progress Notes (Signed)
?  Subjective:  ?Patient ID: Cheryl Mckee, female    DOB: October 11, 1956,   MRN: [id number] ? ?No chief complaint on file. ? ? ?66 y.o. female presents for diabetic foot check. Relates PCP was concerned about calluses on her feet. Relates pain in the left foot primarily and swelling that is new. Relates her A1c has recently bumped up. Patient is diabetic and last A1c was 11.  ? . Denies any other pedal complaints. Denies n/v/f/c.  ? ?PCP: Georgianne Fick MD  ? ?Past Medical History:  ?Diagnosis Date  ? Coronary artery disease   ? Diabetes mellitus without complication (HCC)   ? type 2  ? Fibromyalgia   ? Hypertension   ? Sleep apnea   ? could not tolerate cpap, sleep study 10 yrs ago  ? Urethral diverticulum 2015  ? evaluated by Dr. Wynelle Link  ? Urinary incontinence   ? ? ?Objective:  ?Physical Exam: ?Vascular: DP/PT pulses 2/4 bilateral. CFT <3 seconds. Absent hair growth on digits. Edema noted to bilateral lower extremities. Xerosis noted bilaterally.  ?Skin. No lacerations or abrasions bilateral feet. Nails 1-5 bilateral  are thickened discolored and elongated with subungual debris. Hyperkeratotic tissue noted distal left third digit, upon   debridement 0.2 cm x 0.2 cm x 0.7 cm wound that probes t obone is present. No purulence or drainage noted. There is edema and erythema noted to the diigt.  ?Musculoskeletal: MMT 5/5 bilateral lower extremities in DF, PF, Inversion and Eversion. Deceased ROM in DF of ankle joint.  ?Neurological: Sensation intact to light touch. Protective sensation diminished bilateral.  ? ? ?Assessment:  ? ?1. Diabetes mellitus without complication (HCC)   ?2. Pain due to onychomycosis of toenails of both feet   ?3. Callus of foot   ? ? ? ?Plan:  ?Patient was evaluated and treated and all questions answered. 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  ?X-ray showing erosion of the tip of the distal phalanx of left third digit.  ?-Debridement as below. ?-Dressed with betadine,  DSD. ?-Off-loading with surgical shoe. ?-Doxycycline sent to pharmacy  ?-MRI ordered of left foot.  ?-Discussed glucose control and proper protein-rich diet.  ?-Discussed if any worsening redness, pain, fever or chills to call or may need to report to the emergency room. Patient expressed understanding.  ?-Discussed likely hood for need of amputation of the digit. Will get MRI to evaluate extent of infection. Discussed amputation after this. Patient expressed understanding.  ? ?Procedure: Excisional Debridement of Wound ?Rationale: Removal of non-viable soft tissue from the wound to promote healing.  ?Anesthesia: none ?Pre-Debridement Wound Measurements: Loverylying callus  ?Post-Debridement Wound Measurements:  0.2cm x 0.2 cm x 0.7 cm  ?Type of Debridement: Sharp Excisional ?Tissue Removed: Non-viable soft tissue ?Depth of Debridement: subcutaneous tissue. ?Technique: Sharp excisional debridement to bleeding, viable wound base.  ?Dressing: Dry, sterile, compression dressing. ?Disposition: Patient tolerated procedure well. Patient to return in  2 week for follow-up. ? ?No follow-ups on file. ? ? ? ?Louann Sjogren, DPM  ? ? ?

## 2022-03-20 ENCOUNTER — Ambulatory Visit
Admission: RE | Admit: 2022-03-20 | Discharge: 2022-03-20 | Disposition: A | Discharge status: Home / Self Care | Payer: 59 | Source: Ambulatory Visit | Attending: Podiatry | Admitting: Podiatry

## 2022-03-20 DIAGNOSIS — L97524 Non-pressure chronic ulcer of other part of left foot with necrosis of bone: Secondary | ICD-10-CM

## 2022-03-20 DIAGNOSIS — E119 Type 2 diabetes mellitus without complications: Secondary | ICD-10-CM

## 2022-03-20 MED ORDER — GADOBENATE DIMEGLUMINE 529 MG/ML IV SOLN
20.0000 mL | Freq: Once | INTRAVENOUS | Status: AC | PRN
  Administered 2022-03-20: 20 mL via INTRAVENOUS

## 2022-03-25 ENCOUNTER — Encounter: Payer: Self-pay | Admitting: Podiatry

## 2022-03-25 ENCOUNTER — Ambulatory Visit: Payer: 59 | Admitting: Podiatry

## 2022-03-25 DIAGNOSIS — E119 Type 2 diabetes mellitus without complications: Secondary | ICD-10-CM | POA: Diagnosis not present

## 2022-03-25 DIAGNOSIS — L97524 Non-pressure chronic ulcer of other part of left foot with necrosis of bone: Secondary | ICD-10-CM | POA: Diagnosis not present

## 2022-03-25 MED ORDER — DOXYCYCLINE HYCLATE 100 MG PO TABS: 100.0000 mg | ORAL_TABLET | Freq: Two times a day (BID) | ORAL | 0 refills | Status: DC

## 2022-03-25 NOTE — Addendum Note (Signed)
Addended by: Cranford Mon R on: 03/25/2022 02:49 PM ? ? Modules accepted: Orders ? ?

## 2022-03-25 NOTE — Progress Notes (Signed)
?Subjective:  ?Patient ID: Cheryl Mckee, female    DOB: 1956/02/26,   MRN: [id number] ? ?Chief Complaint  ?Patient presents with  ? Foot Ulcer  ?  The 3rd toe on the left is about the same and my husband tried to cut the wrap off and may have cut me   ? ? ?66 y.o. female presents for follow-up of left third digit wound. Relates she has been dressing it as instructed and has had her MRI done and here to review.  Patient is diabetic and last A1c was 11.  ? . Denies any other pedal complaints. Denies n/v/f/c.  ? ?PCP: Georgianne Fick MD  ? ?Past Medical History:  ?Diagnosis Date  ? Coronary artery disease   ? Diabetes mellitus without complication (HCC)   ? type 2  ? Fibromyalgia   ? Hypertension   ? Sleep apnea   ? could not tolerate cpap, sleep study 10 yrs ago  ? Urethral diverticulum 2015  ? evaluated by Dr. Wynelle Link  ? Urinary incontinence   ? ? ?Objective:  ?Physical Exam: ?Vascular: DP/PT pulses 2/4 bilateral. CFT <3 seconds. Absent hair growth on digits. Edema noted to bilateral lower extremities. Xerosis noted bilaterally.  ?Skin. No lacerations or abrasions bilateral feet. Nails 1-5 bilateral  are thickened discolored and elongated with subungual debris. Hyperkeratotic tissue noted distal left third digit, upon   debridement 0.2 cm x 0.2 cm x 0.7 cm wound that probes t obone is present. No purulence or drainage noted. There is edema and erythema noted to the diigt.  ?Musculoskeletal: MMT 5/5 bilateral lower extremities in DF, PF, Inversion and Eversion. Deceased ROM in DF of ankle joint.  ?Neurological: Sensation intact to light touch. Protective sensation diminished bilateral.  ? ? ?Assessment:  ? ?1. Ulcer of left second toe, with necrosis of bone (HCC)   ?2. Diabetes mellitus without complication (HCC)   ? ? ? ? ?Plan:  ?Patient was evaluated and treated and all questions answered. 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  ?X-ray showing erosion of the tip of the distal phalanx of left third  digit.  ?-Debridement as below. ?-Dressed with betadine, DSD. ?-Off-loading with surgical shoe. 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  ?-Discussed glucose control and proper protein-rich diet.  ?-Discussed if any worsening redness, pain, fever or chills to call or may need to report to the emergency room. Patient expressed understanding.  ?-Discussed need for partial amputation of the left third digit. Patient expressed understanding. Will plan for surgery this coming Tuesday.  ?-Informed surgical risk consent was reviewed and read aloud to the patient.  I reviewed the films.  I have discussed my findings with the patient in great detail.  I have discussed all risks including but not limited to infection, stiffness, scarring, limp, disability, deformity, damage to blood vessels and nerves, numbness, poor healing, need for braces, arthritis, chronic pain, amputation, death.  All benefits and realistic expectations discussed in great detail.  I have made no promises as to the outcome.  I have provided realistic expectations.  I have offered the patient a 2nd opinion, which they have declined and assured me they preferred to proceed despite the risks. ? ? ?Procedure: Excisional Debridement of Wound ?Rationale: Removal of non-viable soft tissue from the wound to promote healing.  ?Anesthesia: none ?Pre-Debridement Wound Measurements: overylying callus  ?Post-Debridement Wound Measurements:  0.2cm x 0.2 cm x 0.7 cm  ?Type of  Debridement: Sharp Excisional ?Tissue Removed: Non-viable soft tissue ?Depth of Debridement: subcutaneous tissue. ?Technique: Sharp excisional debridement to bleeding, viable wound base.  ?Dressing: Dry, sterile, compression dressing. ?Disposition: Patient tolerated procedure well. Patient to return in  2 week for follow-up. ? ?Return if symptoms worsen or fail to improve. ? ? ? ?Louann Sjogren, DPM  ? ? ?

## 2022-03-26 ENCOUNTER — Telehealth: Payer: Self-pay

## 2022-03-26 NOTE — Telephone Encounter (Signed)
DOS 03/31/2022 ? ?AMPUTATION IPJ 3RD LT - 40981 ? ?[employer] EFFECTIVE DATE - 12/07/2021 ? ?PLAN DEDUCTIBLE - $600.00 W/ $0.00 REMAINING ?OUT OF POCKET - $2500.00 W/ $1708.64 REMAINING ?COPAY $0.00 ?COINSURANCE - 20% ? ? ?Notification or Prior Authorization is not required for the requested services ? 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 If you have general questions about the prior authorization requirements, please call us at [phone] or visit [email or web address] > Clinician Resources > Advance and Admission Notification Requirements. The number above acknowledges your notification. Please write this number down for future reference. Notification is not a guarantee of coverage or payment. ? ?Decision ID #:[id number] ?

## 2022-03-28 LAB — WOUND CULTURE
MICRO NUMBER:: 13284618
SPECIMEN QUALITY:: ADEQUATE

## 2022-03-31 ENCOUNTER — Encounter: Payer: Self-pay | Admitting: Podiatry

## 2022-03-31 ENCOUNTER — Other Ambulatory Visit: Payer: Self-pay | Admitting: Podiatry

## 2022-03-31 DIAGNOSIS — L97524 Non-pressure chronic ulcer of other part of left foot with necrosis of bone: Secondary | ICD-10-CM | POA: Diagnosis not present

## 2022-03-31 DIAGNOSIS — M79676 Pain in unspecified toe(s): Secondary | ICD-10-CM

## 2022-03-31 MED ORDER — ONDANSETRON HCL 4 MG PO TABS: 4.0000 mg | ORAL_TABLET | Freq: Three times a day (TID) | ORAL | 0 refills | Status: AC | PRN

## 2022-03-31 MED ORDER — AMOXICILLIN-POT CLAVULANATE 875-125 MG PO TABS: 1.0000 | ORAL_TABLET | Freq: Two times a day (BID) | ORAL | 0 refills | Status: AC

## 2022-03-31 MED ORDER — OXYCODONE-ACETAMINOPHEN 5-325 MG PO TABS: 1.0000 | ORAL_TABLET | ORAL | 0 refills | Status: AC | PRN

## 2022-04-06 ENCOUNTER — Telehealth: Payer: Self-pay | Admitting: *Deleted

## 2022-04-06 NOTE — Telephone Encounter (Signed)
Called patient, no answer, left vmessage of Dr Erasmo Downer instructions.

## 2022-04-06 NOTE — Telephone Encounter (Signed)
Patient is wanting to have her bandages changed, rewrapped, are loose and coming apart, afraid of getting another infection.Please advise. ?

## 2022-04-06 NOTE — Telephone Encounter (Signed)
We can try and get her in earlier to have them changed. If we can't get her in she can always just take the ace wrap off and re-wrap around she should be ok until Wednesday.

## 2022-04-08 ENCOUNTER — Ambulatory Visit: Payer: 59

## 2022-04-08 ENCOUNTER — Ambulatory Visit (INDEPENDENT_AMBULATORY_CARE_PROVIDER_SITE_OTHER): Payer: 59

## 2022-04-08 ENCOUNTER — Encounter: Payer: Self-pay | Admitting: Podiatry

## 2022-04-08 ENCOUNTER — Ambulatory Visit (INDEPENDENT_AMBULATORY_CARE_PROVIDER_SITE_OTHER): Payer: 59 | Admitting: Podiatry

## 2022-04-08 DIAGNOSIS — Z9889 Other specified postprocedural states: Secondary | ICD-10-CM

## 2022-04-08 MED ORDER — OXYCODONE HCL 5 MG PO TABS: 5.0000 mg | ORAL_TABLET | ORAL | 0 refills | Status: AC | PRN

## 2022-04-08 NOTE — Progress Notes (Signed)
?Subjective:  ?Patient ID: Cheryl Mckee, female    DOB: February 05, 1956,  MRN: [id number] ? ?No chief complaint on file. ? ? ?DOS: 03/31/22 ?Procedure: Left third partial digit amputation.   ? ?66 y.o. female returns for POV#1. Doing well not having any pain. Dressings did come a little loose.  ? ?Review of Systems: Negative except as noted in the HPI. Denies N/V/F/Ch. ? ?Past Medical History:  ?Diagnosis Date  ? Coronary artery disease   ? Diabetes mellitus without complication (HCC)   ? type 2  ? Fibromyalgia   ? Hypertension   ? Sleep apnea   ? could not tolerate cpap, sleep study 10 yrs ago  ? Urethral diverticulum 2015  ? evaluated by Dr. Wynelle Link  ? Urinary incontinence   ? ? ?Current Outpatient Medications:  ?  acetaminophen (TYLENOL) 500 MG tablet, Take 1,000 mg by mouth every 6 (six) hours as needed for headache (pain). , Disp: , Rfl:  ?  Ascorbic Acid (VITAMIN C PO), Take 2 tablets by mouth daily., Disp: , Rfl:  ?  atorvastatin (LIPITOR) 40 MG tablet, Take 40 mg by mouth daily., Disp: , Rfl:  ?  brompheniramine-pseudoephedrine-DM 30-2-10 MG/5ML syrup, SMARTSIG:1 teaspoon By Mouth 3 Times Daily, Disp: , Rfl:  ?  COLLAGEN PO, Take 1 tablet by mouth daily., Disp: , Rfl:  ?  furosemide (LASIX) 40 MG tablet, Take 1 tablet (40 mg total) by mouth daily., Disp: 30 tablet, Rfl: 0 ?  insulin aspart (NOVOLOG FLEXPEN) 100 UNIT/ML FlexPen, Inject 13-55 Units into the skin See admin instructions. Inject 25 units subcutaneously before breakfast, 13 units before lunch and 55 units before supper, Disp: , Rfl:  ?  insulin degludec (TRESIBA FLEXTOUCH) 200 UNIT/ML FlexTouch Pen, Inject 125 Units into the skin daily before breakfast., Disp: , Rfl:  ?  levothyroxine (SYNTHROID) 88 MCG tablet, Take 88 mcg by mouth every morning., Disp: , Rfl:  ?  lisinopril (ZESTRIL) 40 MG tablet, Take 1 tablet (40 mg total) by mouth daily., Disp: 30 tablet, Rfl: 0 ?  loperamide (IMODIUM) 2 MG capsule, Take 1 capsule (2 mg total) by mouth every 6  (six) hours as needed for diarrhea or loose stools., Disp: 15 capsule, Rfl: 0 ?  metFORMIN (GLUCOPHAGE-XR) 500 MG 24 hr tablet, Take 1,000 mg by mouth 2 (two) times daily with a meal., Disp: , Rfl:  ?  metoprolol succinate (TOPROL-XL) 25 MG 24 hr tablet, Take 25 mg by mouth daily., Disp: , Rfl:  ?  metoprolol tartrate (LOPRESSOR) 25 MG tablet, Take 1 tablet (25 mg total) by mouth 2 (two) times daily., Disp: 60 tablet, Rfl: 0 ?  Multiple Vitamin (MULTIVITAMIN WITH MINERALS) TABS tablet, Take 1 tablet by mouth daily., Disp: , Rfl:  ?  Multiple Vitamins-Minerals (EYE VITAMINS PO), Take 2 capsules by mouth daily., Disp: , Rfl:  ?  nystatin (MYCOSTATIN/NYSTOP) powder, Apply 1 application topically daily as needed (rash (skin folds))., Disp: , Rfl:  ?  ondansetron (ZOFRAN) 4 MG tablet, Take 1 tablet (4 mg total) by mouth every 8 (eight) hours as needed for nausea or vomiting., Disp: 20 tablet, Rfl: 0 ?  potassium chloride SA (KLOR-CON) 20 MEQ tablet, Take 1 tablet (20 mEq total) by mouth daily., Disp: 15 tablet, Rfl: 0 ?  Semaglutide, 1 MG/DOSE, (OZEMPIC, 1 MG/DOSE,) 2 MG/1.5ML SOPN, Inject 1 mg into the skin every Wednesday., Disp: , Rfl:  ?  telmisartan (MICARDIS) 80 MG tablet, Take 80 mg by mouth daily., Disp: , Rfl:  ?  traZODone (DESYREL) 50 MG tablet, Take 50 mg by mouth at bedtime as needed for sleep. , Disp: , Rfl:  ? ?Social History  ? ?Tobacco Use  ?Smoking Status Never  ?Smokeless Tobacco Never  ? ? ?Allergies  ?Allergen Reactions  ? Other Anaphylaxis  ?  Allergy to cashews, pecans, walnuts, [place] nuts (can only eat peanuts and almonds)  ? Accupril [Quinapril Hcl] Hives  ? Daypro [Oxaprozin] Other (See Comments)  ?  GI upset  ? Zithromax [Azithromycin] Hives  ? ?Objective:  ?There were no vitals filed for this visit. ?There is no height or weight on file to calculate BMI. ?Constitutional Well developed. ?Well nourished.  ?Vascular Foot warm and well perfused. ?Capillary refill normal to all digits.    ?Neurologic Normal speech. ?Oriented to person, place, and time. ?Epicritic sensation to light touch grossly present bilaterally.  ?Dermatologic Skin healing well without signs of infection. Skin edges well coapted without signs of infection.  ?Orthopedic: Tenderness to palpation noted about the surgical site.  ? ?Radiographs: Interval partial amputation of third digit on left  ? ?Assessment:  ? ?1. Post-operative state   ? ?Plan:  ?Patient was evaluated and treated and all questions answered. ? ?S/p foot surgery left ?-Progressing as expected post-operatively. ?-WB Status: WBAT in surgical shoe  ?-Sutures: intact. ?-Medications: refill of pain medication provided.  ?-Foot redressed. ?Follow-up in 2 weeks for suture removal.  ? ?Return in about 2 weeks (around 04/22/2022) for post op.  ? ?

## 2022-04-15 ENCOUNTER — Telehealth: Payer: Self-pay | Admitting: *Deleted

## 2022-04-22 ENCOUNTER — Ambulatory Visit (INDEPENDENT_AMBULATORY_CARE_PROVIDER_SITE_OTHER): Payer: 59 | Admitting: Podiatry

## 2022-04-22 ENCOUNTER — Encounter: Payer: Self-pay | Admitting: Podiatry

## 2022-04-22 DIAGNOSIS — Z9889 Other specified postprocedural states: Secondary | ICD-10-CM

## 2022-04-22 NOTE — Progress Notes (Signed)
?Subjective:  ?Patient ID: Cheryl Mckee, female    DOB: 05-16-1956,  MRN: [id number] ? ?No chief complaint on file. ? ? ?DOS: 03/31/22 ?Procedure: Left third partial digit amputation.   ? ?66 y.o. female returns for POV#2. Doing well not having any pain.  ? ?Review of Systems: Negative except as noted in the HPI. Denies N/V/F/Ch. ? ?Past Medical History:  ?Diagnosis Date  ? Coronary artery disease   ? Diabetes mellitus without complication ([place])   ? type 2  ? Fibromyalgia   ? Hypertension   ? Sleep apnea   ? could not tolerate cpap, sleep study 10 yrs ago  ? Urethral diverticulum 2015  ? evaluated by Dr. Loel Lofty  ? Urinary incontinence   ? ? ?Current Outpatient Medications:  ?  acetaminophen (TYLENOL) 500 MG tablet, Take 1,000 mg by mouth every 6 (six) hours as needed for headache (pain). , Disp: , Rfl:  ?  Ascorbic Acid (VITAMIN C PO), Take 2 tablets by mouth daily., Disp: , Rfl:  ?  atorvastatin (LIPITOR) 40 MG tablet, Take 40 mg by mouth daily., Disp: , Rfl:  ?  brompheniramine-pseudoephedrine-DM 30-2-10 MG/5ML syrup, SMARTSIG:1 teaspoon By Mouth 3 Times Daily, Disp: , Rfl:  ?  COLLAGEN PO, Take 1 tablet by mouth daily., Disp: , Rfl:  ?  furosemide (LASIX) 40 MG tablet, Take 1 tablet (40 mg total) by mouth daily., Disp: 30 tablet, Rfl: 0 ?  insulin aspart (NOVOLOG FLEXPEN) 100 UNIT/ML FlexPen, Inject 13-55 Units into the skin See admin instructions. Inject 25 units subcutaneously before breakfast, 13 units before lunch and 55 units before supper, Disp: , Rfl:  ?  insulin degludec (TRESIBA FLEXTOUCH) 200 UNIT/ML FlexTouch Pen, Inject 125 Units into the skin daily before breakfast., Disp: , Rfl:  ?  levothyroxine (SYNTHROID) 88 MCG tablet, Take 88 mcg by mouth every morning., Disp: , Rfl:  ?  lisinopril (ZESTRIL) 40 MG tablet, Take 1 tablet (40 mg total) by mouth daily., Disp: 30 tablet, Rfl: 0 ?  loperamide (IMODIUM) 2 MG capsule, Take 1 capsule (2 mg total) by mouth every 6 (six) hours as needed for diarrhea or  loose stools., Disp: 15 capsule, Rfl: 0 ?  metFORMIN (GLUCOPHAGE-XR) 500 MG 24 hr tablet, Take 1,000 mg by mouth 2 (two) times daily with a meal., Disp: , Rfl:  ?  metoprolol succinate (TOPROL-XL) 25 MG 24 hr tablet, Take 25 mg by mouth daily., Disp: , Rfl:  ?  metoprolol tartrate (LOPRESSOR) 25 MG tablet, Take 1 tablet (25 mg total) by mouth 2 (two) times daily., Disp: 60 tablet, Rfl: 0 ?  Multiple Vitamin (MULTIVITAMIN WITH MINERALS) TABS tablet, Take 1 tablet by mouth daily., Disp: , Rfl:  ?  Multiple Vitamins-Minerals (EYE VITAMINS PO), Take 2 capsules by mouth daily., Disp: , Rfl:  ?  nystatin (MYCOSTATIN/NYSTOP) powder, Apply 1 application topically daily as needed (rash (skin folds))., Disp: , Rfl:  ?  ondansetron (ZOFRAN) 4 MG tablet, Take 1 tablet (4 mg total) by mouth every 8 (eight) hours as needed for nausea or vomiting., Disp: 20 tablet, Rfl: 0 ?  oxyCODONE (ROXICODONE) 5 MG immediate release tablet, Take 1 tablet (5 mg total) by mouth every 4 (four) hours as needed for severe pain., Disp: 30 tablet, Rfl: 0 ?  potassium chloride SA (KLOR-CON) 20 MEQ tablet, Take 1 tablet (20 mEq total) by mouth daily., Disp: 15 tablet, Rfl: 0 ?  Semaglutide, 1 MG/DOSE, (OZEMPIC, 1 MG/DOSE,) 2 MG/1.5ML SOPN, Inject 1 mg into  the skin every Wednesday., Disp: , Rfl:  ?  telmisartan (MICARDIS) 80 MG tablet, Take 80 mg by mouth daily., Disp: , Rfl:  ?  traZODone (DESYREL) 50 MG tablet, Take 50 mg by mouth at bedtime as needed for sleep. , Disp: , Rfl:  ? ?Social History  ? ?Tobacco Use  ?Smoking Status Never  ?Smokeless Tobacco Never  ? ? ?Allergies  ?Allergen Reactions  ? Other Anaphylaxis  ?  Allergy to cashews, pecans, walnuts, [place] nuts (can only eat peanuts and almonds)  ? Accupril [Quinapril Hcl] Hives  ? Daypro [Oxaprozin] Other (See Comments)  ?  GI upset  ? Zithromax [Azithromycin] Hives  ? ?Objective:  ?There were no vitals filed for this visit. ?There is no height or weight on file to calculate  BMI. ?Constitutional Well developed. ?Well nourished.  ?Vascular Foot warm and well perfused. ?Capillary refill normal to all digits.   ?Neurologic Normal speech. ?Oriented to person, place, and time. ?Epicritic sensation to light touch grossly present bilaterally.  ?Dermatologic Skin healing well without signs of infection. Skin edges well coapted without signs of infection.  ?Orthopedic: Tenderness to palpation noted about the surgical site.  ? ?Radiographs: Interval partial amputation of third digit on left  ? ?Assessment:  ? ?1. Post-operative state   ? ?Plan:  ?Patient was evaluated and treated and all questions answered. ? ?S/p foot surgery left ?-Progressing as expected post-operatively. ?-WB Status: WBAT may transition to regular shoe.  ?-Sutures: removed without incident.  ?-Medications: N/a ?-Foot redressed. May begin showering in two days.  ?May return to work on Monday 5/22  ?Follow-up in 3 weeks.  ? ?No follow-ups on file.  ? ?

## 2022-05-13 ENCOUNTER — Encounter: Payer: Self-pay | Admitting: Podiatry

## 2022-05-13 ENCOUNTER — Ambulatory Visit (INDEPENDENT_AMBULATORY_CARE_PROVIDER_SITE_OTHER): Payer: 59 | Admitting: Podiatry

## 2022-05-13 DIAGNOSIS — M79675 Pain in left toe(s): Secondary | ICD-10-CM

## 2022-05-13 DIAGNOSIS — Z9889 Other specified postprocedural states: Secondary | ICD-10-CM

## 2022-05-13 DIAGNOSIS — E119 Type 2 diabetes mellitus without complications: Secondary | ICD-10-CM | POA: Diagnosis not present

## 2022-05-13 DIAGNOSIS — B351 Tinea unguium: Secondary | ICD-10-CM | POA: Diagnosis not present

## 2022-05-13 DIAGNOSIS — M79674 Pain in right toe(s): Secondary | ICD-10-CM

## 2022-05-13 NOTE — Progress Notes (Signed)
Subjective:  Patient ID: Cheryl Mckee, female    DOB: [birth year],  MRN: [id number]  No chief complaint on file.   DOS: 03/31/22 Procedure: Left third partial digit amputation.    66 y.o. female returns for POV#3. Doing well not having any pain or issues. Requesting to have nails trimmed today.   Review of Systems: Negative except as noted in the HPI. Denies N/V/F/Ch.  Past Medical History:  Diagnosis Date   Coronary artery disease    Diabetes mellitus without complication (HCC)    type 2   Fibromyalgia    Hypertension    Sleep apnea    could not tolerate cpap, sleep study 10 yrs ago   Urethral diverticulum 2015   evaluated by Dr. Wynelle Link   Urinary incontinence     Current Outpatient Medications:    acetaminophen (TYLENOL) 500 MG tablet, Take 1,000 mg by mouth every 6 (six) hours as needed for headache (pain). , Disp: , Rfl:    Ascorbic Acid (VITAMIN C PO), Take 2 tablets by mouth daily., Disp: , Rfl:    atorvastatin (LIPITOR) 40 MG tablet, Take 40 mg by mouth daily., Disp: , Rfl:    brompheniramine-pseudoephedrine-DM 30-2-10 MG/5ML syrup, SMARTSIG:1 teaspoon By Mouth 3 Times Daily, Disp: , Rfl:    COLLAGEN PO, Take 1 tablet by mouth daily., Disp: , Rfl:    furosemide (LASIX) 40 MG tablet, Take 1 tablet (40 mg total) by mouth daily., Disp: 30 tablet, Rfl: 0   insulin aspart (NOVOLOG FLEXPEN) 100 UNIT/ML FlexPen, Inject 13-55 Units into the skin See admin instructions. Inject 25 units subcutaneously before breakfast, 13 units before lunch and 55 units before supper, Disp: , Rfl:    insulin degludec (TRESIBA FLEXTOUCH) 200 UNIT/ML FlexTouch Pen, Inject 125 Units into the skin daily before breakfast., Disp: , Rfl:    levothyroxine (SYNTHROID) 88 MCG tablet, Take 88 mcg by mouth every morning., Disp: , Rfl:    lisinopril (ZESTRIL) 40 MG tablet, Take 1 tablet (40 mg total) by mouth daily., Disp: 30 tablet, Rfl: 0   loperamide (IMODIUM) 2 MG capsule, Take 1 capsule (2 mg total) by  mouth every 6 (six) hours as needed for diarrhea or loose stools., Disp: 15 capsule, Rfl: 0   metFORMIN (GLUCOPHAGE-XR) 500 MG 24 hr tablet, Take 1,000 mg by mouth 2 (two) times daily with a meal., Disp: , Rfl:    metoprolol succinate (TOPROL-XL) 25 MG 24 hr tablet, Take 25 mg by mouth daily., Disp: , Rfl:    metoprolol tartrate (LOPRESSOR) 25 MG tablet, Take 1 tablet (25 mg total) by mouth 2 (two) times daily., Disp: 60 tablet, Rfl: 0   Multiple Vitamin (MULTIVITAMIN WITH MINERALS) TABS tablet, Take 1 tablet by mouth daily., Disp: , Rfl:    Multiple Vitamins-Minerals (EYE VITAMINS PO), Take 2 capsules by mouth daily., Disp: , Rfl:    nystatin (MYCOSTATIN/NYSTOP) powder, Apply 1 application topically daily as needed (rash (skin folds))., Disp: , Rfl:    ondansetron (ZOFRAN) 4 MG tablet, Take 1 tablet (4 mg total) by mouth every 8 (eight) hours as needed for nausea or vomiting., Disp: 20 tablet, Rfl: 0   oxyCODONE (ROXICODONE) 5 MG immediate release tablet, Take 1 tablet (5 mg total) by mouth every 4 (four) hours as needed for severe pain., Disp: 30 tablet, Rfl: 0   potassium chloride SA (KLOR-CON) 20 MEQ tablet, Take 1 tablet (20 mEq total) by mouth daily., Disp: 15 tablet, Rfl: 0   Semaglutide, 1 MG/DOSE, (OZEMPIC, 1  MG/DOSE,) 2 MG/1.5ML SOPN, Inject 1 mg into the skin every Wednesday., Disp: , Rfl:    telmisartan (MICARDIS) 80 MG tablet, Take 80 mg by mouth daily., Disp: , Rfl:    traZODone (DESYREL) 50 MG tablet, Take 50 mg by mouth at bedtime as needed for sleep. , Disp: , Rfl:   Social History   Tobacco Use  Smoking Status Never  Smokeless Tobacco Never    Allergies  Allergen Reactions   Other Anaphylaxis    Allergy to cashews, pecans, walnuts, [place] nuts (can only eat peanuts and almonds)   Accupril [Quinapril Hcl] Hives   Daypro [Oxaprozin] Other (See Comments)    GI upset   Zithromax [Azithromycin] Hives   Objective:  There were no vitals filed for this visit. There is no  height or weight on file to calculate BMI. Constitutional Well developed. Well nourished.  Vascular Foot warm and well perfused. Capillary refill normal to all digits.   Neurologic Normal speech. Oriented to person, place, and time. Epicritic sensation to light touch grossly present bilaterally.  Dermatologic Skin healing well without signs of infection. Skin edges well coapted without signs of infection. Nails 1-5 b/l thickened elongated and with subungual debris.   Orthopedic: Tenderness to palpation noted about the surgical site.   Radiographs: Interval partial amputation of third digit on left   Assessment:   1. Post-operative state   2. Diabetes mellitus without complication (HCC)   3. Pain due to onychomycosis of toenails of both feet    Plan:  Patient was evaluated and treated and all questions answered.  S/p foot surgery left -Progressing as expected post-operatively. -WB Status: WBAT in regular shoes -Medications: N/a.  -Discussed and educated patient on diabetic foot care, especially with  regards to the vascular, neurological and musculoskeletal systems.  -Stressed the importance of good glycemic control and the detriment of not  controlling glucose levels in relation to the foot. -Discussed supportive shoes at all times and checking feet regularly.  -Mechanically debrided all nails 1-5 bilateral using sterile nail nipper and filed with dremel without incident  -Answered all patient questions -Patient to return  in 3 months for at risk foot care -Patient advised to call the office if any problems or questions arise in the meantime.      Return in about 3 months (around 08/13/2022) for rfc.

## 2022-06-03 ENCOUNTER — Other Ambulatory Visit: Payer: 59

## 2022-06-17 ENCOUNTER — Other Ambulatory Visit: Payer: 59

## 2022-06-18 ENCOUNTER — Ambulatory Visit (INDEPENDENT_AMBULATORY_CARE_PROVIDER_SITE_OTHER): Payer: 59 | Admitting: Podiatry

## 2022-06-18 ENCOUNTER — Other Ambulatory Visit: Payer: Self-pay | Admitting: Podiatry

## 2022-06-18 DIAGNOSIS — E1142 Type 2 diabetes mellitus with diabetic polyneuropathy: Secondary | ICD-10-CM

## 2022-06-18 DIAGNOSIS — Z899 Acquired absence of limb, unspecified: Secondary | ICD-10-CM

## 2022-06-18 DIAGNOSIS — Z9889 Other specified postprocedural states: Secondary | ICD-10-CM

## 2022-06-18 DIAGNOSIS — E119 Type 2 diabetes mellitus without complications: Secondary | ICD-10-CM

## 2022-06-18 NOTE — Progress Notes (Signed)
Me

## 2022-06-18 NOTE — Progress Notes (Signed)
Reason for Consult: Evaluation for Bilateral Custom Foot Orthoses  ACTIONS PERFORMED Potential out of pocket cost was communicated to patient. Patient given codes to contact insurance to see if inserts are covered. Patient understood and consent to casting. Patient was casted for Foot Orthoses via crush box. Casts were shipped to central fabrication.   PLAN Patient is to be scheduled for fitting in 6 weeks to pick up inserts.

## 2022-08-05 NOTE — Telephone Encounter (Signed)
Error

## 2022-08-12 ENCOUNTER — Encounter: Payer: Self-pay | Admitting: Podiatry

## 2022-08-12 ENCOUNTER — Ambulatory Visit (INDEPENDENT_AMBULATORY_CARE_PROVIDER_SITE_OTHER): Payer: 59 | Admitting: Podiatry

## 2022-08-12 DIAGNOSIS — L84 Corns and callosities: Secondary | ICD-10-CM | POA: Diagnosis not present

## 2022-08-12 DIAGNOSIS — B351 Tinea unguium: Secondary | ICD-10-CM | POA: Diagnosis not present

## 2022-08-12 DIAGNOSIS — M79675 Pain in left toe(s): Secondary | ICD-10-CM

## 2022-08-12 DIAGNOSIS — M79674 Pain in right toe(s): Secondary | ICD-10-CM

## 2022-08-12 DIAGNOSIS — E1142 Type 2 diabetes mellitus with diabetic polyneuropathy: Secondary | ICD-10-CM

## 2022-08-12 DIAGNOSIS — Z899 Acquired absence of limb, unspecified: Secondary | ICD-10-CM

## 2022-08-12 NOTE — Progress Notes (Signed)
  Subjective:  Patient ID: Cheryl Mckee, female    DOB: 1956/08/22,   MRN: [id number]  Chief Complaint  Patient presents with   Nail Problem     Routine foot care, left foot in between great toe and second toe , patient tried to trim down her callus and accidentally cut herself     66 y.o. female presents for concern of thickened elongated and painful nails that are difficult to trim. Requesting to have them trimmed today. Relates burning and tingling in their feet. 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 Did try to trim a callus on the left foot great toe and cut herself.   Patient is diabetic and last A1c was  Lab Results  Component Value Date   HGBA1C 9.0 (H) 11/11/2020   .   PCP:  Georgianne Fick, MD    . Denies any other pedal complaints. Denies n/v/f/c.   Past Medical History:  Diagnosis Date   Coronary artery disease    Diabetes mellitus without complication (HCC)    type 2   Fibromyalgia    Hypertension    Sleep apnea    could not tolerate cpap, sleep study 10 yrs ago   Urethral diverticulum 2015   evaluated by Dr. Wynelle Link   Urinary incontinence     Objective:  Physical Exam: Vascular: DP/PT pulses 2/4 bilateral. CFT <3 seconds. Absent hair growth on digits. Edema noted to bilateral lower extremities. Xerosis noted bilaterally.  Skin. No lacerations or abrasions bilateral feet. Nails 1-5 bilateral  are thickened discolored and elongated with subungual debris. Hyperkeratotic lesion noted to medial bilateral hallux. Maceration noted in bilateral first third and fourth interspaces.  Musculoskeletal: MMT 5/5 bilateral lower extremities in DF, PF, Inversion and Eversion. Deceased ROM in DF of ankle joint. Amputation of left second digit.  Neurological: Sensation intact to light touch. Protective sensation diminished bilateral.    Assessment:   1. Pain due to onychomycosis of toenails of both feet   2. Type 2 diabetes mellitus with peripheral  neuropathy (HCC)   3. History of amputation   4. Callus of foot      Plan:  Patient was evaluated and treated and all questions answered. -Discussed and educated patient on diabetic foot care, especially with  regards to the vascular, neurological and musculoskeletal systems.  -Stressed the importance of good glycemic control and the detriment of not  controlling glucose levels in relation to the foot. -Discussed supportive shoes at all times and checking feet regularly.  -Mechanically debrided all nails 1-5 bilateral using sterile nail nipper and filed with dremel without incident  -Hyperkeratotic tissue debrided with chisel without incident.  -Advised to keep betadine in between affected areas in between toes.  -Answered all patient questions -Patient to return  in 3 months for at risk foot care -Patient advised to call the office if any problems or questions arise in the meantime.   Louann Sjogren, DPM

## 2022-11-18 ENCOUNTER — Encounter: Payer: Self-pay | Admitting: Podiatry

## 2022-11-18 ENCOUNTER — Ambulatory Visit: Payer: 59 | Admitting: Podiatry

## 2022-11-18 VITALS — BP 200/110 | HR 74

## 2022-11-18 DIAGNOSIS — M79674 Pain in right toe(s): Secondary | ICD-10-CM

## 2022-11-18 DIAGNOSIS — R600 Localized edema: Secondary | ICD-10-CM

## 2022-11-18 DIAGNOSIS — E1142 Type 2 diabetes mellitus with diabetic polyneuropathy: Secondary | ICD-10-CM

## 2022-11-18 DIAGNOSIS — L84 Corns and callosities: Secondary | ICD-10-CM | POA: Diagnosis not present

## 2022-11-18 DIAGNOSIS — B351 Tinea unguium: Secondary | ICD-10-CM | POA: Diagnosis not present

## 2022-11-18 DIAGNOSIS — Z89422 Acquired absence of other left toe(s): Secondary | ICD-10-CM | POA: Diagnosis not present

## 2022-11-18 DIAGNOSIS — E119 Type 2 diabetes mellitus without complications: Secondary | ICD-10-CM | POA: Diagnosis not present

## 2022-11-18 DIAGNOSIS — M79675 Pain in left toe(s): Secondary | ICD-10-CM

## 2022-11-18 DIAGNOSIS — B353 Tinea pedis: Secondary | ICD-10-CM

## 2022-11-18 NOTE — Patient Instructions (Addendum)
Spray Lotrimin Spray Powder between toes once daily. Follow up next week with Dr. Sherryle Lis for follow up of fungal infection.  Athlete's Foot Athlete's foot (tinea pedis) is a fungal infection of the skin on your feet. It often occurs on the skin that is between or underneath the toes. It can also occur on the soles of your feet. The infection can spread from person to person (is contagious). It can also spread when a person's bare feet come in contact with the fungus on shower floors or on items such as shoes. What are the causes? This condition is caused by a fungus that grows in warm, moist places. You can get athlete's foot by sharing shoes, shower stalls, towels, and wet floors with someone who is infected. Not washing your feet or changing your socks often enough can also lead to athlete's foot. What increases the risk? This condition is more likely to develop in: Men. People who have a weak body defense system (immune system). People who have diabetes. People who use public showers, such as at a gym. People who wear heavy-duty shoes, such as Environmental manager. Seasons with warm, humid weather. What are the signs or symptoms? Symptoms of this condition include: Itchy areas between your toes or on the soles of your feet. White, flaky, or scaly areas between your toes or on the soles of your feet. Very itchy small blisters between your toes or on the soles of your feet. Small cuts in your skin. These cuts can become infected. Thick or discolored toenails. How is this diagnosed? This condition may be diagnosed with a physical exam and a review of your medical history. Your health care provider may also take a skin or toenail sample to examine under a microscope. How is this treated? This condition is treated with antifungal medicines. These may be applied as powders, ointments, or creams. In severe cases, an oral antifungal medicine may be given. Follow these instructions at  home: Medicines Apply or take over-the-counter and prescription medicines only as told by your health care provider. Apply your antifungal medicine as told by your health care provider. Do not stop using the antifungal even if your condition improves. Foot care Do not scratch your feet. Keep your feet dry: Wear cotton or wool socks. Change your socks every day or if they become wet. Wear shoes that allow air to flow, such as sandals or canvas tennis shoes. Wash and dry your feet, including the area between your toes. Also, wash and dry your feet: Every day or as told by your health care provider. After exercising. General instructions Do not let others use towels, shoes, nail clippers, or other personal items that touch your feet. Protect your feet by wearing sandals in wet areas, such as locker rooms and shared showers. Keep all follow-up visits. This is important. If you have diabetes, keep your blood sugar under control. Contact a health care provider if: You have a fever. You have swelling, soreness, warmth, or redness in your foot. Your feet are not getting better with treatment. Your symptoms get worse. You have new symptoms. You have severe pain. Summary Athlete's foot (tinea pedis) is a fungal infection of the skin on your feet. It often occurs on skin that is between or underneath the toes. This condition is caused by a fungus that grows in warm, moist places. Symptoms include white, flaky, or scaly areas between your toes or on the soles of your feet. This condition is treated with  antifungal medicines. Keep your feet clean. Always dry them thoroughly. This information is not intended to replace advice given to you by your health care provider. Make sure you discuss any questions you have with your health care provider. Document Revised: 03/16/2021 Document Reviewed: 03/16/2021 Elsevier Patient Education  2023 [employer].

## 2022-11-18 NOTE — Progress Notes (Signed)
ANNUAL DIABETIC FOOT EXAM  Subjective: Cheryl Mckee presents today for annual diabetic foot examination. Patient states she saw Dr. Ralene Cork on last visit. She relates history of attempting to trim hard skin on her left 2nd toe and it bled. She applied antibiotic ointment to it.  Chief Complaint  Patient presents with   Diabetes    Diabetic foot care, A1c-8.0 BG- patient is not taking, PCP was last seen in sept 2023   Patient confirms h/o diabetes.  Patient relates 23 year h/o diabetes.  Patient has h/o amputation(s): digital amputation L 3rd toe.  Risk factors:  amputation left 3rd toe, diabetes, HTN, CAD, hyperlipidemia.  Georgianne Fick, MD is patient's PCP.  Past Medical History:  Diagnosis Date   Coronary artery disease    Diabetes mellitus without complication (HCC)    type 2   Fibromyalgia    Hypertension    Sleep apnea    could not tolerate cpap, sleep study 10 yrs ago   Urethral diverticulum 2015   evaluated by Dr. Wynelle Link   Urinary incontinence    Patient Active Problem List   Diagnosis Date Noted   Exertional dyspnea 11/10/2020   Morbid obesity (HCC) 06/15/2014   Obstructive sleep apnea 06/15/2014   Noncompliance 06/15/2014   Diabetes mellitus without complication (HCC)    Depression    Hypertension    Cirrhosis, biliary (HCC)    Fibromyalgia    Past Surgical History:  Procedure Laterality Date   CARDIAC CATHETERIZATION  2001   nonobstructive cad   COLONOSCOPY  10/05/12   CYSTOSCOPY  03/07/2020   Procedure: CYSTOSCOPY;  Surgeon: Crist Fat, MD;  Location: [facility];  Service: Urology;;   DILATATION & CURETTAGE/HYSTEROSCOPY WITH MYOSURE N/A 09/27/2015   Procedure: DILATATION & CURETTAGE/HYSTEROSCOPY WITH MYOSURE;  Surgeon: Dara Lords, MD;  Location: [facility];  Service: Gynecology;  Laterality: N/A;   FOOT SURGERY Left    KNEE SURGERY Left    meniscus repair   URETHRAL DIVERTICULUM REPAIR N/A 03/07/2020   Procedure:  TRANSVAGINAL URETHRAL DIVERTICULECTOMY;  Surgeon: Crist Fat, MD;  Location: [facility];  Service: Urology;  Laterality: N/A;   Current Outpatient Medications on File Prior to Visit  Medication Sig Dispense Refill   acetaminophen (TYLENOL) 500 MG tablet Take 1,000 mg by mouth every 6 (six) hours as needed for headache (pain).      Ascorbic Acid (VITAMIN C PO) Take 2 tablets by mouth daily.     atorvastatin (LIPITOR) 40 MG tablet Take 40 mg by mouth daily.     brompheniramine-pseudoephedrine-DM 30-2-10 MG/5ML syrup SMARTSIG:1 teaspoon By Mouth 3 Times Daily     COLLAGEN PO Take 1 tablet by mouth daily.     furosemide (LASIX) 40 MG tablet Take 1 tablet (40 mg total) by mouth daily. 30 tablet 0   insulin aspart (NOVOLOG FLEXPEN) 100 UNIT/ML FlexPen Inject 13-55 Units into the skin See admin instructions. Inject 25 units subcutaneously before breakfast, 13 units before lunch and 55 units before supper     insulin degludec (TRESIBA FLEXTOUCH) 200 UNIT/ML FlexTouch Pen Inject 125 Units into the skin daily before breakfast.     levothyroxine (SYNTHROID) 88 MCG tablet Take 88 mcg by mouth every morning.     lisinopril (ZESTRIL) 40 MG tablet Take 1 tablet (40 mg total) by mouth daily. 30 tablet 0   loperamide (IMODIUM) 2 MG capsule Take 1 capsule (2 mg total) by mouth every 6 (six) hours as needed for diarrhea  or loose stools. 15 capsule 0   metFORMIN (GLUCOPHAGE-XR) 500 MG 24 hr tablet Take 1,000 mg by mouth 2 (two) times daily with a meal.     metoprolol succinate (TOPROL-XL) 25 MG 24 hr tablet Take 25 mg by mouth daily.     metoprolol tartrate (LOPRESSOR) 25 MG tablet Take 1 tablet (25 mg total) by mouth 2 (two) times daily. 60 tablet 0   Multiple Vitamin (MULTIVITAMIN WITH MINERALS) TABS tablet Take 1 tablet by mouth daily.     Multiple Vitamins-Minerals (EYE VITAMINS PO) Take 2 capsules by mouth daily.     nystatin (MYCOSTATIN/NYSTOP) powder Apply 1 application topically  daily as needed (rash (skin folds)).     ondansetron (ZOFRAN) 4 MG tablet Take 1 tablet (4 mg total) by mouth every 8 (eight) hours as needed for nausea or vomiting. 20 tablet 0   oxyCODONE (ROXICODONE) 5 MG immediate release tablet Take 1 tablet (5 mg total) by mouth every 4 (four) hours as needed for severe pain. 30 tablet 0   potassium chloride SA (KLOR-CON) 20 MEQ tablet Take 1 tablet (20 mEq total) by mouth daily. 15 tablet 0   Semaglutide, 1 MG/DOSE, (OZEMPIC, 1 MG/DOSE,) 2 MG/1.5ML SOPN Inject 1 mg into the skin every Wednesday.     telmisartan (MICARDIS) 80 MG tablet Take 80 mg by mouth daily.     traZODone (DESYREL) 50 MG tablet Take 50 mg by mouth at bedtime as needed for sleep.      No current facility-administered medications on file prior to visit.    Allergies  Allergen Reactions   Other Anaphylaxis    Allergy to cashews, pecans, walnuts, [place] nuts (can only eat peanuts and almonds)   Accupril [Quinapril Hcl] Hives   Daypro [Oxaprozin] Other (See Comments)    GI upset   Zithromax [Azithromycin] Hives   Social History   Occupational History   Not on file  Tobacco Use   Smoking status: Never   Smokeless tobacco: Never  Vaping Use   Vaping Use: Never used  Substance and Sexual Activity   Alcohol use: No    Alcohol/week: 0.0 standard drinks of alcohol   Drug use: No   Sexual activity: Yes    Birth control/protection: Post-menopausal    Comment: 1st intercourse 66 yo-Fewer than 5 partners   Family History  Problem Relation Age of Onset   Diabetes Mother    Hypertension Mother    Diabetes Father    Cancer Father        Deceased, 70s--Unknown type cancer   Cancer Maternal Aunt        Ovarian or Uterine   Breast cancer Cousin        Maternal 1st cousin-Age 69   Immunization History  Administered Date(s) Administered   Influenza-Unspecified 09/07/2015, 09/16/2017     Review of Systems: Negative except as noted in the HPI.   Objective: Vitals:   11/18/22  0849 11/18/22 0941  BP: (!) 241/104 (!) 200/110  Patient asymptomatic; instructed to take her blood pressure medication and contact PCP/Cardiologist  Pulse: 74    Debbe BalesSharon A Rappa is a pleasant 66 y.o. female in NAD. AAO X 3.  Vascular Examination: Capillary refill time immediate b/l. Vascular status with faintly palpable DP pulses and palpable PT pulses. Pedal hair absent b/l. No pain with calf compression b/l. Skin temperature gradient WNL b/l. +1 pitting edema right lower extremity. +2 pitting edema left lower extremity. Evidence of chronic venous insufficiency b/l lower extremities left >  right. No ischemia or gangrene noted b/l LE. No cyanosis or clubbing noted b/l LE.  Neurological Examination: Protective sensation diminished with 10g monofilament b/l. Vibratory sensation intact b/l.  Dermatological Examination:              Toenails 1-5 b/l thick, discolored, elongated with subungual debris and pain on dorsal palpation. Interdigital maceration noted noted  left 1st, left 3rd, and b/l 4th webspace(s). No blistering, no weeping, no open wounds. Hyperkeratotic lesion(s) 4th webspace sulcus right foot and 3rd webspace sulcus left foot.  No erythema, no edema, no drainage, no fluctuance.  Musculoskeletal Examination: Normal muscle strength 5/5 to all lower extremity muscle groups bilaterally. Lower extremity amputation(s): digital amputation L 3rd toe.Marland Kitchen No pain, crepitus or joint limitation noted with ROM b/l LE.  Patient ambulates independently without assistive aids.  Radiographs: None  Footwear Assessment: Does the patient wear appropriate shoes? No. Does the patient need inserts/orthotics? Yes.  ADA Risk Categorization:  High Risk : s/p amputation left 3rd toe Patient has one or more of the following: Loss of protective sensation Absent pedal pulses Severe Foot deformity History of foot ulcer  Assessment: 1. Pain due to onychomycosis of toenails of both feet   2.  Callus of foot   3. Tinea pedis of both feet   4. Status post amputation of lesser toe of left foot (HCC)   5. Bilateral lower extremity edema   6. Type 2 diabetes mellitus with peripheral neuropathy (HCC)   7. Encounter for diabetic foot exam (HCC)     Plan: Orders Placed This Encounter  Procedures   Ambulatory referral to Vascular Surgery    Referral Priority:   Routine    Referral Type:   Surgical    Referral Reason:   Specialty Services Required    Referred to Provider:   Maeola Harman, MD    Requested Specialty:   Vascular Surgery    Number of Visits Requested:   1   AMB REFERRAL TO VASCULAR SURGERY/CLINIC  -Patient was evaluated and treated. All patient's and/or POA's questions/concerns answered on today's visit. -Ambulatory referral to vascular surgery for risk factors diabetes s/p left 3rd toe amputation, lower extremity edema and chronic venous insufficiency . -Diabetic foot examination performed today. -Discussed tinea pedis. Webspaces painted with betadine solution today. Tomorrow, she is to apply Lotrimin Spray Powder between toes once daily. Instructed to dry well between her toes. Clean tub/shower with bleach based cleanser to avoid re-infection and passing infection onto others. -Patient/POA educated on dangers of using sharp instrumentation on toes/feet. Recommended continued professional foot care in presence of diabetes and neuropathy. Patient/POA relates understanding. -Toenails 1-5 right foot, L hallux, L 2nd toe, L 4th toe, and L 5th toe debrided in length and girth without iatrogenic bleeding with sterile nail nipper and dremel.  -Callus(es) sulcus 4th webspace right foot, sulcus 3rd webspace left foot, left great toe, L 2nd toe, and L 4th toe pared utilizing sterile scalpel blade. Pinpoint bleeding left 2nd to addressed with Lumicain Hemostatic Solution. TAO and band-aid applied. No further treatment required. Total number debrided =5. -Discussed shoe  gear. She is wearing leather pointed toe flats which are causing pressure to toes. Avoid leather shoes. Shoe recommendations given for Skechers with stretchable uppers and memory foam insoles. -Follow up in one week with Dr. Lilian Kapur for continued management of tinea pedis infection. -Follow up for at risk foot care in 3 months. -Patient/POA to call should there be question/concern in the interim. Return in  about 15 days (around 12/03/2022).  Freddie Breech, DPM

## 2022-12-03 ENCOUNTER — Ambulatory Visit: Payer: 59 | Admitting: Podiatry

## 2022-12-03 DIAGNOSIS — B353 Tinea pedis: Secondary | ICD-10-CM

## 2022-12-03 MED ORDER — TERBINAFINE HCL 250 MG PO TABS: 250.0000 mg | ORAL_TABLET | Freq: Every day | ORAL | 0 refills | Status: DC

## 2022-12-03 MED ORDER — KETOCONAZOLE 2 % EX CREA: 1.0000 | TOPICAL_CREAM | Freq: Two times a day (BID) | CUTANEOUS | 2 refills | Status: DC

## 2022-12-03 NOTE — Progress Notes (Signed)
  Subjective:  Patient ID: Cheryl Mckee, female    DOB: 08-Mar-1956,  MRN: [id number]  Chief Complaint  Patient presents with   Tinea Pedis    interdigital fungal infection bilaterally - left>right /pick up inserts only    66 y.o. female presents with the above complaint. History confirmed with patient.  She notes there is still itching and moisture between the toes.  She has not been using the spray to come pick up  Objective:  Physical Exam: warm, good capillary refill, no trophic changes or ulcerative lesions, normal DP and PT pulses, normal sensory exam, and interdigital tinea pedis of all interspaces  Assessment:   1. Tinea pedis of both feet      Plan:  Patient was evaluated and treated and all questions answered.  Discussed the etiology and treatment options for tinea pedis.  Discussed topical and oral treatment.  Recommended topical treatment with 2% ketoconazole cream and p.o. Lamisil for 30 days.  This was sent to the patient's pharmacy.  Also discussed appropriate foot hygiene, use of antifungal spray such as Tinactin in shoes, as well as cleaning her foot surfaces such as showers and bathroom floors with bleach.   Return in about 1 month (around 01/03/2023) for f/u tinea pedis .

## 2022-12-03 NOTE — Patient Instructions (Signed)
Use antifungal spray like lotrimin or tinactin and spray inside shoes every day

## 2022-12-05 ENCOUNTER — Ambulatory Visit
Admission: EM | Admit: 2022-12-05 | Discharge: 2022-12-05 | Disposition: A | Discharge status: Home / Self Care | Payer: 59 | Attending: Internal Medicine | Admitting: Internal Medicine

## 2022-12-05 ENCOUNTER — Other Ambulatory Visit: Payer: Self-pay

## 2022-12-05 ENCOUNTER — Encounter: Payer: Self-pay | Admitting: Emergency Medicine

## 2022-12-05 DIAGNOSIS — R030 Elevated blood-pressure reading, without diagnosis of hypertension: Secondary | ICD-10-CM | POA: Diagnosis present

## 2022-12-05 DIAGNOSIS — Z20822 Contact with and (suspected) exposure to covid-19: Secondary | ICD-10-CM | POA: Diagnosis present

## 2022-12-05 DIAGNOSIS — J069 Acute upper respiratory infection, unspecified: Secondary | ICD-10-CM | POA: Diagnosis present

## 2022-12-05 MED ORDER — FLUTICASONE PROPIONATE 50 MCG/ACT NA SUSP: 1.0000 | Freq: Every day | NASAL | 0 refills | Status: AC

## 2022-12-05 MED ORDER — BENZONATATE 100 MG PO CAPS: 100.0000 mg | ORAL_CAPSULE | Freq: Three times a day (TID) | ORAL | 0 refills | Status: DC | PRN

## 2022-12-05 NOTE — ED Triage Notes (Signed)
Pt here for possible covid exposure now having cough, diarrhea and nasal congestion

## 2022-12-05 NOTE — Discharge Instructions (Addendum)
You have a viral illness which is most likely COVID-19 given your close exposure.  I have prescribed you a few different medications to help alleviate your symptoms.  Please follow-up if symptoms persist or worsen.  COVID test is pending.  We will call if it is positive.  Your blood pressure is significantly elevated so I want you to take your blood pressure medication as soon as possible and monitor very closely at home with a home blood pressure cuff that you can get from the pharmacy.  Please go straight to the emergency department if it remains elevated.

## 2022-12-05 NOTE — ED Provider Notes (Signed)
[name]    CSN: [id number] Arrival date & time: 12/05/22  1300      History   Chief Complaint Chief Complaint  Patient presents with   Cough    HPI Cheryl Mckee is a 66 y.o. female.   Patient presents with 5 to 6-day history of cough and nasal congestion.  Patient reports exposure to COVID-19 at work.  Denies any known fevers.  Denies chest pain, shortness of breath, nausea, vomiting, diarrhea, abdominal pain.  Patient has taken NyQuil with minimal improvement in symptoms.  Denies history of asthma or COPD.  Patient has significantly elevated blood pressure reading.  Reports that she has not taken her blood pressure medication today.  Patient does not report headache, dizziness, blurred vision.   Cough   Past Medical History:  Diagnosis Date   Coronary artery disease    Diabetes mellitus without complication (HCC)    type 2   Fibromyalgia    Hypertension    Sleep apnea    could not tolerate cpap, sleep study 10 yrs ago   Urethral diverticulum 2015   evaluated by Dr. Wynelle Link   Urinary incontinence     Patient Active Problem List   Diagnosis Date Noted   Exertional dyspnea 11/10/2020   Morbid obesity (HCC) 06/15/2014   Obstructive sleep apnea 06/15/2014   Noncompliance 06/15/2014   Diabetes mellitus without complication (HCC)    Depression    Hypertension    Cirrhosis, biliary (HCC)    Fibromyalgia     Past Surgical History:  Procedure Laterality Date   CARDIAC CATHETERIZATION  2001   nonobstructive cad   COLONOSCOPY  10/05/12   CYSTOSCOPY  03/07/2020   Procedure: CYSTOSCOPY;  Surgeon: Crist Fat, MD;  Location: [facility];  Service: Urology;;   DILATATION & CURETTAGE/HYSTEROSCOPY WITH MYOSURE N/A 09/27/2015   Procedure: DILATATION & CURETTAGE/HYSTEROSCOPY WITH MYOSURE;  Surgeon: Dara Lords, MD;  Location: [facility];  Service: Gynecology;  Laterality: N/A;   FOOT SURGERY Left    KNEE SURGERY Left     meniscus repair   URETHRAL DIVERTICULUM REPAIR N/A 03/07/2020   Procedure: TRANSVAGINAL URETHRAL DIVERTICULECTOMY;  Surgeon: Crist Fat, MD;  Location: [facility];  Service: Urology;  Laterality: N/A;    OB History     Gravida  0   Para      Term      Preterm      AB      Living         SAB      IAB      Ectopic      Multiple      Live Births               Home Medications    Prior to Admission medications   Medication Sig Start Date End Date Taking? Authorizing Provider  benzonatate (TESSALON) 100 MG capsule Take 1 capsule (100 mg total) by mouth every 8 (eight) hours as needed for cough. 12/05/22  Yes Zamarian Scarano, Rolly Salter E, FNP  fluticasone (FLONASE) 50 MCG/ACT nasal spray Place 1 spray into both nostrils daily. 12/05/22  Yes Brentt Fread, Rolly Salter E, FNP  acetaminophen (TYLENOL) 500 MG tablet Take 1,000 mg by mouth every 6 (six) hours as needed for headache (pain).     [provider]  Ascorbic Acid (VITAMIN C PO) Take 2 tablets by mouth daily.    [provider]  atorvastatin (LIPITOR) 40 MG tablet Take 40 mg  by mouth daily.    [provider]  brompheniramine-pseudoephedrine-DM 30-2-10 MG/5ML syrup SMARTSIG:1 teaspoon By Mouth 3 Times Daily 12/11/21   [provider]  COLLAGEN PO Take 1 tablet by mouth daily.    [provider]  furosemide (LASIX) 40 MG tablet Take 1 tablet (40 mg total) by mouth daily. 11/13/20   Glade Lloyd, MD  insulin aspart (NOVOLOG FLEXPEN) 100 UNIT/ML FlexPen Inject 13-55 Units into the skin See admin instructions. Inject 25 units subcutaneously before breakfast, 13 units before lunch and 55 units before supper    [provider]  insulin degludec (TRESIBA FLEXTOUCH) 200 UNIT/ML FlexTouch Pen Inject 125 Units into the skin daily before breakfast.    [provider]  ketoconazole (NIZORAL) 2 % cream Apply 1 Application topically 2 (two) times daily. Apply thin layer  between toes 12/03/22   Edwin Cap, DPM  levothyroxine (SYNTHROID) 88 MCG tablet Take 88 mcg by mouth every morning. 01/30/22   [provider]  lisinopril (ZESTRIL) 40 MG tablet Take 1 tablet (40 mg total) by mouth daily. 11/12/20   Glade Lloyd, MD  loperamide (IMODIUM) 2 MG capsule Take 1 capsule (2 mg total) by mouth every 6 (six) hours as needed for diarrhea or loose stools. 11/12/20   Glade Lloyd, MD  metFORMIN (GLUCOPHAGE-XR) 500 MG 24 hr tablet Take 1,000 mg by mouth 2 (two) times daily with a meal.    [provider]  metoprolol succinate (TOPROL-XL) 25 MG 24 hr tablet Take 25 mg by mouth daily. 12/08/21   [provider]  metoprolol tartrate (LOPRESSOR) 25 MG tablet Take 1 tablet (25 mg total) by mouth 2 (two) times daily. 11/12/20   Glade Lloyd, MD  Multiple Vitamin (MULTIVITAMIN WITH MINERALS) TABS tablet Take 1 tablet by mouth daily.    [provider]  Multiple Vitamins-Minerals (EYE VITAMINS PO) Take 2 capsules by mouth daily.    [provider]  nystatin (MYCOSTATIN/NYSTOP) powder Apply 1 application topically daily as needed (rash (skin folds)).    [provider]  ondansetron (ZOFRAN) 4 MG tablet Take 1 tablet (4 mg total) by mouth every 8 (eight) hours as needed for nausea or vomiting. 03/31/22   Louann Sjogren, DPM  oxyCODONE (ROXICODONE) 5 MG immediate release tablet Take 1 tablet (5 mg total) by mouth every 4 (four) hours as needed for severe pain. 04/08/22   Louann Sjogren, DPM  potassium chloride SA (KLOR-CON) 20 MEQ tablet Take 1 tablet (20 mEq total) by mouth daily. 11/12/20   Glade Lloyd, MD  Semaglutide, 1 MG/DOSE, (OZEMPIC, 1 MG/DOSE,) 2 MG/1.5ML SOPN Inject 1 mg into the skin every Wednesday.    [provider]  telmisartan (MICARDIS) 80 MG tablet Take 80 mg by mouth daily. 01/30/22   [provider]  terbinafine (LAMISIL) 250 MG tablet Take 1 tablet (250 mg total) by mouth daily. 12/03/22    McDonald, Rachelle Hora, DPM  traZODone (DESYREL) 50 MG tablet Take 50 mg by mouth at bedtime as needed for sleep.     [provider]    Family History Family History  Problem Relation Age of Onset   Diabetes Mother    Hypertension Mother    Diabetes Father    Cancer Father        Deceased, 70s--Unknown type cancer   Cancer Maternal Aunt        Ovarian or Uterine   Breast cancer Cousin        Maternal 1st cousin-Age 40  Social History Social History   Tobacco Use   Smoking status: Never   Smokeless tobacco: Never  Vaping Use   Vaping Use: Never used  Substance Use Topics   Alcohol use: No    Alcohol/week: 0.0 standard drinks of alcohol   Drug use: No     Allergies   Other, Accupril [quinapril hcl], Daypro [oxaprozin], and Zithromax [azithromycin]   Review of Systems Review of Systems Per HPI  Physical Exam Triage Vital Signs ED Triage Vitals  Enc Vitals Group     BP 12/05/22 1559 (!) 213/96     Pulse Rate 12/05/22 1559 83     Resp 12/05/22 1559 18     Temp 12/05/22 1559 98 F (36.7 C)     Temp Source 12/05/22 1559 Oral     SpO2 12/05/22 1559 97 %     Weight --      Height --      Head Circumference --      Peak Flow --      Pain Score 12/05/22 1600 3     Pain Loc --      Pain Edu? --      Excl. in GC? --    No data found.  Updated Vital Signs BP (!) 213/96 (BP Location: Left Arm)   Pulse 83   Temp 98 F (36.7 C) (Oral)   Resp 18   LMP 08/07/2014   SpO2 97%   Visual Acuity Right Eye Distance:   Left Eye Distance:   Bilateral Distance:    Right Eye Near:   Left Eye Near:    Bilateral Near:     Physical Exam Constitutional:      General: She is not in acute distress.    Appearance: Normal appearance. She is not toxic-appearing or diaphoretic.  HENT:     Head: Normocephalic and atraumatic.     Right Ear: Tympanic membrane and ear canal normal.     Left Ear: Tympanic membrane and ear canal normal.     Nose: Congestion present.      Mouth/Throat:     Mouth: Mucous membranes are moist.     Pharynx: No posterior oropharyngeal erythema.  Eyes:     Extraocular Movements: Extraocular movements intact.     Conjunctiva/sclera: Conjunctivae normal.     Pupils: Pupils are equal, round, and reactive to light.  Cardiovascular:     Rate and Rhythm: Normal rate and regular rhythm.     Pulses: Normal pulses.     Heart sounds: Normal heart sounds.  Pulmonary:     Effort: Pulmonary effort is normal. No respiratory distress.     Breath sounds: Normal breath sounds. No wheezing.  Abdominal:     General: Abdomen is flat. Bowel sounds are normal. There is no distension.     Palpations: Abdomen is soft.     Tenderness: There is no abdominal tenderness.  Musculoskeletal:        General: Normal range of motion.     Cervical back: Normal range of motion.  Skin:    General: Skin is warm and dry.  Neurological:     General: No focal deficit present.     Mental Status: She is alert and oriented to person, place, and time. Mental status is at baseline.     Cranial Nerves: Cranial nerves 2-12 are intact.     Sensory: Sensation is intact.     Motor: Motor function is intact.     Coordination: Coordination is  intact.     Gait: Gait is intact.  Psychiatric:        Mood and Affect: Mood normal.        Behavior: Behavior normal.      UC Treatments / Results  Labs (all labs ordered are listed, but only abnormal results are displayed) Labs Reviewed  SARS CORONAVIRUS 2 (TAT 6-24 HRS)    EKG   Radiology No results found.  Procedures Procedures (including critical care time)  Medications Ordered in UC Medications - No data to display  Initial Impression / Assessment and Plan / UC Course  I have reviewed the triage vital signs and the nursing notes.  Pertinent labs & imaging results that were available during my care of the patient were reviewed by me and considered in my medical decision making (see chart for  details).     Patient presents with symptoms likely from a viral upper respiratory infection.  Most likely COVID-19 given patient's close exposure. Do not suspect underlying cardiopulmonary process. Symptoms seem unlikely related to ACS, CHF or COPD exacerbations, pneumonia, pneumothorax. Patient is nontoxic appearing and not in need of emergent medical intervention.  COVID test pending per patient request.  Patient does not qualify for antivirals given duration of symptoms.  Recommended symptom control with medications and supportive care.  Patient sent prescriptions.  Patient has significantly elevated blood pressure reading with recheck being similar.  Patient is asymptomatic regarding blood pressure and neuroexam is normal.  She also reports that she has not taken her blood pressure medication today which is most likely reason for elevated blood pressure reading.  Blood pressure check at previous doctor visit a few weeks prior was similar.  Therefore, do not think that emergent evaluation is necessary at this time.  Patient advised to take her blood pressure medication as soon as possible and to monitor very closely with home blood pressure cuff.  She was advised to go straight to the ER if blood pressure remains elevated.  She voiced understanding.  Return if symptoms fail to improve. Patient states understanding and is agreeable.  Discharged with PCP followup.  Final Clinical Impressions(s) / UC Diagnoses   Final diagnoses:  Viral upper respiratory tract infection with cough  Elevated blood pressure reading  Close exposure to COVID-19 virus     Discharge Instructions      You have a viral illness which is most likely COVID-19 given your close exposure.  I have prescribed you a few different medications to help alleviate your symptoms.  Please follow-up if symptoms persist or worsen.  COVID test is pending.  We will call if it is positive.  Your blood pressure is significantly elevated  so I want you to take your blood pressure medication as soon as possible and monitor very closely at home with a home blood pressure cuff that you can get from the pharmacy.  Please go straight to the emergency department if it remains elevated.    ED Prescriptions     Medication Sig Dispense Auth. Provider   benzonatate (TESSALON) 100 MG capsule Take 1 capsule (100 mg total) by mouth every 8 (eight) hours as needed for cough. 21 capsule [place], SpringfieldHaley E, [place]NP   fluticasone [facility](FLONASE) 50 MCG/ACT nasal spray Place 1 spray into both nostrils daily. 16 g Gustavus BryantMound, Tyce Delcid E, [place]NP      PDMP not reviewed this encounter.   Gustavus BryantMound, Quenesha Douglass E, [place]NP 12/05/22 [phone]

## 2022-12-07 LAB — SARS CORONAVIRUS 2 (TAT 6-24 HRS): SARS Coronavirus 2: NEGATIVE

## 2022-12-30 ENCOUNTER — Ambulatory Visit: Payer: 59 | Admitting: Podiatry

## 2022-12-30 DIAGNOSIS — M79675 Pain in left toe(s): Secondary | ICD-10-CM

## 2022-12-30 DIAGNOSIS — B351 Tinea unguium: Secondary | ICD-10-CM

## 2022-12-30 DIAGNOSIS — B353 Tinea pedis: Secondary | ICD-10-CM

## 2022-12-30 DIAGNOSIS — M79674 Pain in right toe(s): Secondary | ICD-10-CM

## 2022-12-30 MED ORDER — TERBINAFINE HCL 250 MG PO TABS: 250.0000 mg | ORAL_TABLET | Freq: Every day | ORAL | 0 refills | Status: AC

## 2023-01-02 ENCOUNTER — Encounter: Payer: Self-pay | Admitting: Podiatry

## 2023-01-02 NOTE — Progress Notes (Signed)
  Subjective:  Patient ID: Cheryl Mckee, female    DOB: 29-Jul-1956,  MRN: [id number]  Chief Complaint  Patient presents with   Tinea Pedis    4 week follow up both feet    67 y.o. female presents with the above complaint. History confirmed with patient.  She notes there is improvement less itching and moisture but doing somewhat better  Objective:  Physical Exam: warm, good capillary refill, no trophic changes or ulcerative lesions, normal DP and PT pulses, normal sensory exam, and interdigital tinea pedis of all interspaces, improved but not completely eliminated  Assessment:   1. Tinea pedis of both feet   2. Pain due to onychomycosis of toenails of both feet      Plan:  Patient was evaluated and treated and all questions answered.  Has had improvement.  We again discussed foot hygiene and cleaning surfaces as well as use of antifungal sprays and powders.  I recommend we refill her Lamisil and continue the ketoconazole this was sent to pharmacy.   Return in about 1 month (around 01/30/2023) for check tinea pedis.

## 2023-01-10 ENCOUNTER — Other Ambulatory Visit: Payer: Self-pay | Admitting: Podiatry

## 2023-01-27 ENCOUNTER — Encounter: Payer: Self-pay | Admitting: Podiatry

## 2023-01-27 ENCOUNTER — Ambulatory Visit: Payer: 59 | Admitting: Podiatry

## 2023-01-27 DIAGNOSIS — B353 Tinea pedis: Secondary | ICD-10-CM

## 2023-01-27 NOTE — Progress Notes (Signed)
  Subjective:  Patient ID: Cheryl Mckee, female    DOB: Dec 11, 1955,  MRN: [id number]  Chief Complaint  Patient presents with   Tinea Pedis    f/u tinea pedis    67 y.o. female presents with the above complaint. History confirmed with patient.  She again notes that is improved some last visit  Objective:  Physical Exam: warm, good capillary refill, no trophic changes or ulcerative lesions, normal DP and PT pulses, normal sensory exam, and interdigital tinea pedis still present in fourth interspace but improved did not remainders  Assessment:   1. Tinea pedis of both feet      Plan:  Patient was evaluated and treated and all questions answered.  Overall doing well I do not see indication for further oral antifungal therapy at this point she will continue using the ketoconazole once a day.  Also recommended daily use of Betadine ointment from the toes to dry up further moisture and drainage which continues to still be an issue.  Return to see me as needed if this does not improve or worsens   Return if symptoms worsen or fail to improve.

## 2023-02-22 ENCOUNTER — Ambulatory Visit: Payer: 59 | Admitting: Podiatry

## 2023-02-22 ENCOUNTER — Encounter: Payer: Self-pay | Admitting: Podiatry

## 2023-02-22 DIAGNOSIS — L84 Corns and callosities: Secondary | ICD-10-CM

## 2023-02-22 DIAGNOSIS — E1142 Type 2 diabetes mellitus with diabetic polyneuropathy: Secondary | ICD-10-CM

## 2023-02-22 DIAGNOSIS — M79674 Pain in right toe(s): Secondary | ICD-10-CM

## 2023-02-22 DIAGNOSIS — B353 Tinea pedis: Secondary | ICD-10-CM

## 2023-02-22 DIAGNOSIS — M79675 Pain in left toe(s): Secondary | ICD-10-CM

## 2023-02-22 DIAGNOSIS — B351 Tinea unguium: Secondary | ICD-10-CM

## 2023-02-22 NOTE — Progress Notes (Signed)
  Subjective:  Patient ID: Cheryl Mckee, female    DOB: [birth year],   MRN: [id number]  Chief Complaint  Patient presents with   Nail Problem    Nail trim    67 y.o. female presents for concern of thickened elongated and painful nails that are difficult to trim. Requesting to have them trimmed today. Relates burning and tingling in their feet. 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   Patient is diabetic and last A1c was  Lab Results  Component Value Date   HGBA1C 9.0 (H) 11/11/2020   .   PCP:  Merrilee Seashore, MD    . Denies any other pedal complaints. Denies n/v/f/c.   Past Medical History:  Diagnosis Date   Coronary artery disease    Diabetes mellitus without complication ([place])    type 2   Fibromyalgia    Hypertension    Sleep apnea    could not tolerate cpap, sleep study 10 yrs ago   Urethral diverticulum 2015   evaluated by Dr. Loel Lofty   Urinary incontinence     Objective:  Physical Exam: Vascular: DP/PT pulses 2/4 bilateral. CFT <3 seconds. Absent hair growth on digits. Edema noted to bilateral lower extremities. Xerosis noted bilaterally.  Skin. No lacerations or abrasions bilateral feet. Nails 1-5 bilateral  are thickened discolored and elongated with subungual debris. Hyperkeratotic lesion noted to medial bilateral hallux. Maceration noted in bilateral first third and fourth interspaces.  Musculoskeletal: MMT 5/5 bilateral lower extremities in DF, PF, Inversion and Eversion. Deceased ROM in DF of ankle joint. Amputation of left second digit.  Neurological: Sensation intact to light touch. Protective sensation diminished bilateral.    Assessment:   1. Pain due to onychomycosis of toenails of both feet   2. Type 2 diabetes mellitus with peripheral neuropathy (HCC)   3. Tinea pedis of both feet       Plan:  Patient was evaluated and treated and all questions answered. -Discussed and educated patient on diabetic foot care, especially with   regards to the vascular, neurological and musculoskeletal systems.  -Stressed the importance of good glycemic control and the detriment of not  controlling glucose levels in relation to the foot. -Discussed supportive shoes at all times and checking feet regularly.  -Mechanically debrided all nails 1-5 bilateral using sterile nail nipper and filed with dremel without incident  -Hyperkeratotic tissue debrided with chisel without incident.  -Advised to keep betadine in between affected areas in between toes.  -Answered all patient questions -Patient to return  in 3 months for at risk foot care -Patient advised to call the office if any problems or questions arise in the meantime.   Lorenda Peck, DPM

## 2023-05-25 ENCOUNTER — Ambulatory Visit: Payer: 59 | Admitting: Podiatry

## 2023-06-15 ENCOUNTER — Encounter: Payer: Self-pay | Admitting: Podiatry

## 2023-06-15 ENCOUNTER — Ambulatory Visit: Payer: 59 | Admitting: Podiatry

## 2023-06-15 DIAGNOSIS — M79674 Pain in right toe(s): Secondary | ICD-10-CM | POA: Diagnosis not present

## 2023-06-15 DIAGNOSIS — B351 Tinea unguium: Secondary | ICD-10-CM | POA: Diagnosis not present

## 2023-06-15 DIAGNOSIS — M79675 Pain in left toe(s): Secondary | ICD-10-CM

## 2023-06-15 DIAGNOSIS — E1142 Type 2 diabetes mellitus with diabetic polyneuropathy: Secondary | ICD-10-CM | POA: Diagnosis not present

## 2023-06-15 NOTE — Progress Notes (Signed)
  Subjective:  Patient ID: Cheryl Mckee, female    DOB: 1956/04/07,   MRN: [id number]  Chief Complaint  Patient presents with   Nail Problem     Routine foot care    67 y.o. female presents for concern of thickened elongated and painful nails that are difficult to trim. Requesting to have them trimmed today. Relates burning and tingling in their feet. 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   Patient is diabetic and last A1c was  Lab Results  Component Value Date   HGBA1C 9.0 (H) 11/11/2020   .   PCP:  Georgianne Fick, MD    . Denies any other pedal complaints. Denies n/v/f/c.   Past Medical History:  Diagnosis Date   Coronary artery disease    Diabetes mellitus without complication (HCC)    type 2   Fibromyalgia    Hypertension    Sleep apnea    could not tolerate cpap, sleep study 10 yrs ago   Urethral diverticulum 2015   evaluated by Dr. Wynelle Link   Urinary incontinence     Objective:  Physical Exam: Vascular: DP/PT pulses 2/4 bilateral. CFT <3 seconds. Absent hair growth on digits. Edema noted to bilateral lower extremities. Xerosis noted bilaterally.  Skin. No lacerations or abrasions bilateral feet. Nails 1-5 bilateral  are thickened discolored and elongated with subungual debris. Hyperkeratotic lesion noted to medial bilateral hallux. Maceration noted in bilateral first third and fourth interspaces.  Musculoskeletal: MMT 5/5 bilateral lower extremities in DF, PF, Inversion and Eversion. Deceased ROM in DF of ankle joint. Amputation of left second digit.  Neurological: Sensation intact to light touch. Protective sensation diminished bilateral.    Assessment:   1. Pain due to onychomycosis of toenails of both feet   2. Type 2 diabetes mellitus with peripheral neuropathy (HCC)        Plan:  Patient was evaluated and treated and all questions answered. -Discussed and educated patient on diabetic foot care, especially with  regards to the  vascular, neurological and musculoskeletal systems.  -Stressed the importance of good glycemic control and the detriment of not  controlling glucose levels in relation to the foot. -Discussed supportive shoes at all times and checking feet regularly.  -Mechanically debrided all nails 1-5 bilateral using sterile nail nipper and filed with dremel without incident  -Maceration noted to bilateral third and fourth interspaces. Betadine applied and advised on care. -Advised to keep betadine in between affected areas in between toes.  -Answered all patient questions -Patient to return  in 3 months for at risk foot care -Patient advised to call the office if any problems or questions arise in the meantime.   Louann Sjogren, DPM

## 2023-07-12 ENCOUNTER — Other Ambulatory Visit: Payer: Self-pay | Admitting: Podiatry

## 2023-09-15 ENCOUNTER — Encounter: Payer: Self-pay | Admitting: Podiatry

## 2023-09-15 ENCOUNTER — Ambulatory Visit: Payer: 59 | Admitting: Podiatry

## 2023-09-15 DIAGNOSIS — M79675 Pain in left toe(s): Secondary | ICD-10-CM | POA: Diagnosis not present

## 2023-09-15 DIAGNOSIS — E1142 Type 2 diabetes mellitus with diabetic polyneuropathy: Secondary | ICD-10-CM

## 2023-09-15 DIAGNOSIS — M79674 Pain in right toe(s): Secondary | ICD-10-CM | POA: Diagnosis not present

## 2023-09-15 DIAGNOSIS — B351 Tinea unguium: Secondary | ICD-10-CM

## 2023-09-15 NOTE — Progress Notes (Signed)
  Subjective:  Patient ID: Cheryl Mckee, female    DOB: [birth year],   MRN: [id number]  Chief Complaint  Patient presents with   Nail Problem    RFC.    67 y.o. female presents for concern of thickened elongated and painful nails that are difficult to trim. Requesting to have them trimmed today. Relates burning and tingling in their feet. 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   Patient is diabetic and last A1c was  Lab Results  Component Value Date   HGBA1C 9.0 (H) 11/11/2020   .   PCP:  Georgianne Fick, MD    . Denies any other pedal complaints. Denies n/v/f/c.   Past Medical History:  Diagnosis Date   Coronary artery disease    Diabetes mellitus without complication (HCC)    type 2   Fibromyalgia    Hypertension    Sleep apnea    could not tolerate cpap, sleep study 10 yrs ago   Urethral diverticulum 2015   evaluated by Dr. Wynelle Link   Urinary incontinence     Objective:  Physical Exam: Vascular: DP/PT pulses 2/4 bilateral. CFT <3 seconds. Absent hair growth on digits. Edema noted to bilateral lower extremities. Xerosis noted bilaterally.  Skin. No lacerations or abrasions bilateral feet. Nails 1-5 bilateral  are thickened discolored and elongated with subungual debris. Hyperkeratotic lesion noted to medial bilateral hallux. Maceration noted in bilateral first third and fourth interspaces. Improved on right but still present on left.  Musculoskeletal: MMT 5/5 bilateral lower extremities in DF, PF, Inversion and Eversion. Deceased ROM in DF of ankle joint. Amputation of left second digit.  Neurological: Sensation intact to light touch. Protective sensation diminished bilateral.    Assessment:   1. Pain due to onychomycosis of toenails of both feet   2. Type 2 diabetes mellitus with peripheral neuropathy (HCC)         Plan:  Patient was evaluated and treated and all questions answered. -Discussed and educated patient on diabetic foot care,  especially with  regards to the vascular, neurological and musculoskeletal systems.  -Stressed the importance of good glycemic control and the detriment of not  controlling glucose levels in relation to the foot. -Discussed supportive shoes at all times and checking feet regularly.  -Mechanically debrided all nails 1-5 bilateral using sterile nail nipper and filed with dremel without incident  -Maceration noted to bilateral third and fourth interspaces. Betadine applied and advised on care. -Advised to keep betadine in between affected areas in between toes.  -Answered all patient questions -Patient to return  in 3 months for at risk foot care -Patient advised to call the office if any problems or questions arise in the meantime.   Louann Sjogren, DPM

## 2023-12-22 ENCOUNTER — Ambulatory Visit: Payer: 59 | Admitting: Podiatry

## 2023-12-25 ENCOUNTER — Ambulatory Visit
Admission: EM | Admit: 2023-12-25 | Discharge: 2023-12-25 | Disposition: A | Discharge status: Home / Self Care | Payer: 59

## 2023-12-25 DIAGNOSIS — L089 Local infection of the skin and subcutaneous tissue, unspecified: Secondary | ICD-10-CM

## 2023-12-25 DIAGNOSIS — I10 Essential (primary) hypertension: Secondary | ICD-10-CM

## 2023-12-25 MED ORDER — SULFAMETHOXAZOLE-TRIMETHOPRIM 800-160 MG PO TABS: 1.0000 | ORAL_TABLET | Freq: Two times a day (BID) | ORAL | 0 refills | Status: AC

## 2023-12-25 MED ORDER — AMLODIPINE BESYLATE 10 MG PO TABS: 10.0000 mg | ORAL_TABLET | Freq: Every day | ORAL | 0 refills | Status: AC

## 2023-12-25 MED ORDER — CEPHALEXIN 500 MG PO CAPS: 500.0000 mg | ORAL_CAPSULE | Freq: Three times a day (TID) | ORAL | 0 refills | Status: DC

## 2023-12-25 NOTE — ED Provider Notes (Signed)
[name]    CSN: [id number] Arrival date & time: 12/25/23  1128      History   Chief Complaint Chief Complaint  Patient presents with   Skin Problem    HPI Cheryl Mckee is a 68 y.o. female.   Patient presents today with a 3-day history of enlarging and painful cyst in her posterior neck.  Reports that this has been present for several years but became inflamed a few days ago.  It has previously become infected and she required antibiotics to manage this but has not had any antibiotics in the past 90 days.  She has not had an I&D in the past.  She reports pain is rated 10 on a 0-10 pain scale, localized to affected area but is spreading across her neck, described as pressure, no alleviating factors identified.  She denies any fever, nausea, vomiting.  Denies history of recurrent skin infections or MRSA.  Denies any recent hospitalization or surgical procedure.  She has been taking over-the-counter medication including Tylenol, ibuprofen, Bayer back and body without improvement of symptoms.  Her blood pressure is very elevated.  Review of last few visits indicate that this is at baseline.  She does report compliance with antihypertensive medication.  Her current regimen includes telmisartan, amlodipine, metoprolol.  She does report a history of whitecoat syndrome and reports that her blood pressure generally is better when she is not at doctor's office.  She is not monitoring it closely at home.  Denies any chest pain, shortness of breath, headache, vision change, dizziness.    Past Medical History:  Diagnosis Date   Coronary artery disease    Diabetes mellitus without complication (HCC)    type 2   Fibromyalgia    Hypertension    Sleep apnea    could not tolerate cpap, sleep study 10 yrs ago   Urethral diverticulum 2015   evaluated by Dr. Wynelle Link   Urinary incontinence     Patient Active Problem List   Diagnosis Date Noted   Exertional dyspnea 11/10/2020    Morbid obesity (HCC) 06/15/2014   Obstructive sleep apnea 06/15/2014   Noncompliance 06/15/2014   Diabetes mellitus without complication (HCC)    Depression    Hypertension    Cirrhosis, biliary (HCC)    Fibromyalgia     Past Surgical History:  Procedure Laterality Date   CARDIAC CATHETERIZATION  2001   nonobstructive cad   COLONOSCOPY  10/05/12   CYSTOSCOPY  03/07/2020   Procedure: CYSTOSCOPY;  Surgeon: Crist Fat, MD;  Location: [facility];  Service: Urology;;   DILATATION & CURETTAGE/HYSTEROSCOPY WITH MYOSURE N/A 09/27/2015   Procedure: DILATATION & CURETTAGE/HYSTEROSCOPY WITH MYOSURE;  Surgeon: Dara Lords, MD;  Location: [facility];  Service: Gynecology;  Laterality: N/A;   FOOT SURGERY Left    KNEE SURGERY Left    meniscus repair   URETHRAL DIVERTICULUM REPAIR N/A 03/07/2020   Procedure: TRANSVAGINAL URETHRAL DIVERTICULECTOMY;  Surgeon: Crist Fat, MD;  Location: [facility];  Service: Urology;  Laterality: N/A;    OB History     Gravida  0   Para      Term      Preterm      AB      Living         SAB      IAB      Ectopic      Multiple      Live Births  Home Medications    Prior to Admission medications   Medication Sig Start Date End Date Taking? Authorizing Provider  acetaminophen (TYLENOL) 500 MG tablet Take 1,000 mg by mouth every 6 (six) hours as needed for headache (pain).    Yes [provider]  cephALEXin (KEFLEX) 500 MG capsule Take 1 capsule (500 mg total) by mouth 3 (three) times daily. 12/25/23  Yes Scarlett Portlock K, PA-C  Continuous Glucose Sensor (DEXCOM G7 SENSOR) MISC USE 1 SENSOR EVERY 10 DAYS 12/07/23  Yes [provider]  furosemide (LASIX) 20 MG tablet Take 20 mg by mouth every morning. 11/10/23  Yes [provider]  guanFACINE (TENEX) 2 MG tablet Take 2 mg by mouth at bedtime. 11/24/23  Yes [provider]  insulin aspart (NOVOLOG  FLEXPEN) 100 UNIT/ML FlexPen Inject 13-55 Units into the skin See admin instructions. Inject 25 units subcutaneously before breakfast, 13 units before lunch and 55 units before supper   Yes [provider]  insulin degludec (TRESIBA FLEXTOUCH) 200 UNIT/ML FlexTouch Pen Inject 100 Units into the skin daily before breakfast.   Yes [provider]  metoprolol succinate (TOPROL-XL) 25 MG 24 hr tablet Take 25 mg by mouth daily. 12/08/21  Yes [provider]  Multiple Vitamins-Minerals (EYE VITAMINS PO) Take 2 capsules by mouth daily.   Yes [provider]  OZEMPIC, 2 MG/DOSE, 8 MG/3ML SOPN Inject 2 mg into the skin once a week. 12/09/23  Yes [provider]  sulfamethoxazole-trimethoprim (BACTRIM DS) 800-160 MG tablet Take 1 tablet by mouth 2 (two) times daily for 7 days. 12/25/23 01/01/24 Yes Warnie Belair K, PA-C  telmisartan (MICARDIS) 80 MG tablet Take 80 mg by mouth daily. 01/30/22  Yes [provider]  amLODipine (NORVASC) 10 MG tablet Take 1 tablet (10 mg total) by mouth daily. 12/25/23   Shayna Eblen, Noberto Retort, PA-C  COLLAGEN PO Take 1 tablet by mouth daily.    [provider]  fluticasone (FLONASE) 50 MCG/ACT nasal spray Place 1 spray into both nostrils daily. 12/05/22   Gustavus Bryant, FNP  ketoconazole (NIZORAL) 2 % cream APPLY 1 APPLICATION TOPICALLY 2 (TWO) TIMES DAILY. APPLY THIN LAYER BETWEEN TOES 07/13/23   Standiford, Jenelle Mages, DPM  LAXATIVE 5 MG EC tablet Take 5 mg by mouth daily as needed. 09/20/23   [provider]  levothyroxine (SYNTHROID) 88 MCG tablet Take 88 mcg by mouth every morning. 01/30/22   [provider]  loperamide (IMODIUM) 2 MG capsule Take 1 capsule (2 mg total) by mouth every 6 (six) hours as needed for diarrhea or loose stools. 11/12/20   Glade Lloyd, MD  metFORMIN (GLUCOPHAGE-XR) 500 MG 24 hr tablet Take 1,000 mg by mouth 2 (two) times daily with a meal.    [provider]  Multiple Vitamin  (MULTIVITAMIN WITH MINERALS) TABS tablet Take 1 tablet by mouth daily.    [provider]  nystatin (MYCOSTATIN/NYSTOP) powder Apply 1 application topically daily as needed (rash (skin folds)).    [provider]  ondansetron (ZOFRAN) 4 MG tablet Take 1 tablet (4 mg total) by mouth every 8 (eight) hours as needed for nausea or vomiting. 03/31/22   Louann Sjogren, DPM  oxyCODONE (ROXICODONE) 5 MG immediate release tablet Take 1 tablet (5 mg total) by mouth every 4 (four) hours as needed for severe pain. 04/08/22   Louann Sjogren, DPM  polyethylene glycol-electrolytes (NULYTELY) 420 g solution Take 4,000 mLs by mouth See admin instructions. 09/20/23   [provider]  Semaglutide, 1 MG/DOSE, (OZEMPIC, 1 MG/DOSE,) 2 MG/1.5ML SOPN Inject 1 mg into the skin every Wednesday.    [provider]  terbinafine (LAMISIL) 250 MG tablet Take 1 tablet (250 mg total) by mouth daily. 12/30/22   McDonald, Rachelle Hora, DPM  traZODone (DESYREL) 50 MG tablet Take 50 mg by mouth at bedtime as needed for sleep.     [provider]    Family History Family History  Problem Relation Age of Onset   Diabetes Mother    Hypertension Mother    Diabetes Father    Cancer Father        Deceased, 70s--Unknown type cancer   Cancer Maternal Aunt        Ovarian or Uterine   Breast cancer Cousin        Maternal 1st cousin-Age 19    Social History Social History   Tobacco Use   Smoking status: Never   Smokeless tobacco: Never  Vaping Use   Vaping status: Never Used  Substance Use Topics   Alcohol use: No    Alcohol/week: 0.0 standard drinks of alcohol   Drug use: No     Allergies   Other, Accupril [quinapril hcl], Daypro [oxaprozin], and Zithromax [azithromycin]   Review of Systems Review of Systems  Constitutional:  Positive for activity change. Negative for appetite change, fatigue and fever.  Eyes:  Negative for visual disturbance.  Respiratory:  Negative for  shortness of breath.   Cardiovascular:  Negative for chest pain and palpitations.  Gastrointestinal:  Negative for abdominal pain, diarrhea, nausea and vomiting.  Skin:  Positive for color change and wound.  Neurological:  Negative for dizziness, light-headedness and headaches.     Physical Exam Triage Vital Signs ED Triage Vitals  Encounter Vitals Group     BP 12/25/23 1306 (!) 219/82     Systolic BP Percentile --      Diastolic BP Percentile --      Pulse Rate 12/25/23 1306 78     Resp 12/25/23 1306 20     Temp 12/25/23 1306 97.7 F (36.5 C)     Temp Source 12/25/23 1306 Oral     SpO2 12/25/23 1306 97 %     Weight 12/25/23 1300 300 lb (136.1 kg)     Height 12/25/23 1300 5' 9.75" (1.772 m)     Head Circumference --      Peak Flow --      Pain Score 12/25/23 1300 10     Pain Loc --      Pain Education --      Exclude from Growth Chart --    No data found.  Updated Vital Signs BP (!) 233/85 (BP Location: Right Arm)   Pulse 76   Temp 97.7 F (36.5 C) (Oral)   Resp 20   Ht 5' 9.75" (1.772 m)   Wt 300 lb (136.1 kg)   LMP 08/07/2014   SpO2 97%   BMI 43.35 kg/m   Visual Acuity Right Eye Distance:   Left Eye Distance:   Bilateral Distance:    Right Eye Near:   Left Eye Near:    Bilateral Near:     Physical Exam Vitals reviewed.  Constitutional:      General: She is awake. She is not in acute distress.    Appearance: Normal appearance. She is well-developed. She is not ill-appearing.     Comments: Very pleasant female appears stated age in no acute distress sitting comfortably in  exam room  HENT:     Head: Normocephalic and atraumatic.  Neck:   Cardiovascular:     Rate and Rhythm: Normal rate and regular rhythm.     Heart sounds: Normal heart sounds, S1 normal and S2 normal. No murmur heard. Pulmonary:     Effort: Pulmonary effort is normal.     Breath sounds: Normal breath sounds. No wheezing, rhonchi or rales.     Comments: Clear to auscultation  bilaterally Abdominal:     Palpations: Abdomen is soft.     Tenderness: There is no abdominal tenderness.  Musculoskeletal:     Cervical back: Erythema present. Pain with movement and muscular tenderness present. No spinous process tenderness.  Lymphadenopathy:     Cervical: No cervical adenopathy.  Skin:    Findings: Abscess and erythema present.  Psychiatric:        Behavior: Behavior is cooperative.      UC Treatments / Results  Labs (all labs ordered are listed, but only abnormal results are displayed) Labs Reviewed - No data to display  EKG   Radiology No results found.  Procedures Procedures (including critical care time)  Medications Ordered in UC Medications - No data to display  Initial Impression / Assessment and Plan / UC Course  I have reviewed the triage vital signs and the nursing notes.  Pertinent labs & imaging results that were available during my care of the patient were reviewed by me and considered in my medical decision making (see chart for details).     Patient is well-appearing, afebrile, nontoxic, nontachycardic.  We discussed potential utility of needle aspiration to determine if there is fluctuant material that would be amenable to I&D but patient reports that she would not be able to tolerate this and so declined this intervention.  Will start Bactrim DS and cephalexin for 1 week.  She is to hold her metformin while on the Bactrim DS to avoid increased serum concentration of metformin.  She was encouraged to use warm compresses to encourage drainage and help manage discomfort.  She can use over-the-counter analgesic such as Tylenol but is to avoid NSAIDs due to elevated blood pressure.  Recommend close follow-up with surgeon; she is to call and schedule an appointment for [facility] next week.  We discussed that if anything worsens and she has increasing pain, swelling, fever, nausea, vomiting, difficulty moving her neck, headache she needs to go to the  ER to which she expressed understanding.  Strict return precautions given.  Excuse note provided.  Her blood pressure is very elevated today.  She denies any signs/symptoms of endorgan damage.  This appears to be her baseline blood pressure.  I did increase her amlodipine to 10 mg daily and she will continue her other medicines.  Recommend she avoid decongestants, caffeine, sodium, NSAIDs.  She is to follow-up with her primary care first thing next week for blood pressure recheck and medication adjustment.  Discussed that if she develops any chest pain, shortness of breath, headache, vision change, dizziness she needs to go to the ER immediately.  Strict return precautions given.  Final Clinical Impressions(s) / UC Diagnoses   Final diagnoses:  Infection of skin of neck  Elevated blood pressure reading in office with diagnosis of hypertension     Discharge Instructions      We are going to start antibiotics.  Start cephalexin 3 times daily and Bactrim DS twice daily.  Do not take your metformin while on the Bactrim DS and for  3 days after finishing it as it can increase the risk of kidney injury related to elevated metformin levels.  Use warm compresses as we discussed.  Take Tylenol for pain.  Follow-up with the surgeon first thing next week.  If anything worsens and you develop fever, worsening pain, difficulty moving your neck, nausea, vomiting, headache you need to go to the ER immediately.  Your blood pressure is very elevated.  I have increased your amlodipine to 10 mg (you previously took 5).  Avoid decongestants, caffeine, sodium, NSAIDs (aspirin, ibuprofen/Advil, naproxen/Aleve).  Follow-up with your primary care first thing next week for blood pressure recheck.  If you develop any chest pain, shortness of breath, headache, vision change, dizziness in the setting of high blood pressure you need to go to the emergency room immediately.     ED Prescriptions     Medication Sig Dispense  Auth. Provider   amLODipine (NORVASC) 10 MG tablet Take 1 tablet (10 mg total) by mouth daily. 30 tablet Isao Seltzer K, PA-C   sulfamethoxazole-trimethoprim (BACTRIM DS) 800-160 MG tablet Take 1 tablet by mouth 2 (two) times daily for 7 days. 14 tablet Lailanie Hasley K, PA-C   cephALEXin (KEFLEX) 500 MG capsule Take 1 capsule (500 mg total) by mouth 3 (three) times daily. 21 capsule Yissel Habermehl K, PA-C      PDMP not reviewed this encounter.   Jeani Hawking, PA-C 12/25/23 1337

## 2023-12-25 NOTE — ED Triage Notes (Signed)
"  I have a cyst on my neck that has been their for years but it is now inflamed coming around both sides of my neck and back". No fever.

## 2023-12-25 NOTE — Discharge Instructions (Signed)
We are going to start antibiotics.  Start cephalexin 3 times daily and Bactrim DS twice daily.  Do not take your metformin while on the Bactrim DS and for 3 days after finishing it as it can increase the risk of kidney injury related to elevated metformin levels.  Use warm compresses as we discussed.  Take Tylenol for pain.  Follow-up with the surgeon first thing next week.  If anything worsens and you develop fever, worsening pain, difficulty moving your neck, nausea, vomiting, headache you need to go to the ER immediately.  Your blood pressure is very elevated.  I have increased your amlodipine to 10 mg (you previously took 5).  Avoid decongestants, caffeine, sodium, NSAIDs (aspirin, ibuprofen/Advil, naproxen/Aleve).  Follow-up with your primary care first thing next week for blood pressure recheck.  If you develop any chest pain, shortness of breath, headache, vision change, dizziness in the setting of high blood pressure you need to go to the emergency room immediately.

## 2023-12-27 ENCOUNTER — Encounter: Payer: Self-pay | Admitting: Podiatry

## 2023-12-27 ENCOUNTER — Ambulatory Visit: Payer: 59 | Admitting: Podiatry

## 2023-12-27 DIAGNOSIS — E1142 Type 2 diabetes mellitus with diabetic polyneuropathy: Secondary | ICD-10-CM | POA: Diagnosis not present

## 2023-12-27 DIAGNOSIS — B351 Tinea unguium: Secondary | ICD-10-CM | POA: Diagnosis not present

## 2023-12-27 DIAGNOSIS — M79675 Pain in left toe(s): Secondary | ICD-10-CM

## 2023-12-27 DIAGNOSIS — M79674 Pain in right toe(s): Secondary | ICD-10-CM

## 2023-12-27 MED ORDER — KETOCONAZOLE 2 % EX CREA: 1.0000 | TOPICAL_CREAM | Freq: Every day | CUTANEOUS | 2 refills | Status: DC

## 2023-12-27 NOTE — Progress Notes (Signed)
  Subjective:  Patient ID: Cheryl Mckee, female    DOB: Jun 13, 1956,   MRN: [id number]  No chief complaint on file.   68 y.o. female presents for concern of thickened elongated and painful nails that are difficult to trim. Requesting to have them trimmed today. Relates burning and tingling in their feet. 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   Patient is diabetic and last A1c was  Lab Results  Component Value Date   HGBA1C 9.0 (H) 11/11/2020   .   PCP:  Georgianne Fick, MD    . Denies any other pedal complaints. Denies n/v/f/c.   Past Medical History:  Diagnosis Date   Coronary artery disease    Diabetes mellitus without complication (HCC)    type 2   Fibromyalgia    Hypertension    Sleep apnea    could not tolerate cpap, sleep study 10 yrs ago   Urethral diverticulum 2015   evaluated by Dr. Wynelle Link   Urinary incontinence     Objective:  Physical Exam: Vascular: DP/PT pulses 2/4 bilateral. CFT <3 seconds. Absent hair growth on digits. Edema noted to bilateral lower extremities. Xerosis noted bilaterally.  Skin. No lacerations or abrasions bilateral feet. Nails 1-5 bilateral  are thickened discolored and elongated with subungual debris. Hyperkeratotic lesion noted to medial bilateral hallux. Maceration noted in bilateral first third and fourth interspaces. Improved on right but still present on left.  Musculoskeletal: MMT 5/5 bilateral lower extremities in DF, PF, Inversion and Eversion. Deceased ROM in DF of ankle joint. Amputation of left second digit.  Neurological: Sensation intact to light touch. Protective sensation diminished bilateral.    Assessment:   1. Pain due to onychomycosis of toenails of both feet   2. Type 2 diabetes mellitus with peripheral neuropathy (HCC)         Plan:  Patient was evaluated and treated and all questions answered. -Discussed and educated patient on diabetic foot care, especially with  regards to the vascular,  neurological and musculoskeletal systems.  -Stressed the importance of good glycemic control and the detriment of not  controlling glucose levels in relation to the foot. -Discussed supportive shoes at all times and checking feet regularly.  -Mechanically debrided all nails 1-5 bilateral using sterile nail nipper and filed with dremel without incident  -Maceration noted to left first third and fourth interspaced. Betadine applied and advised on care. -Ketoconazole sent into be applied nightly. Advised to apply betadine in the mornings.  -Advised to keep betadine in between affected areas in between toes.  -Answered all patient questions -Patient to return  in 3 months for at risk foot care -Patient advised to call the office if any problems or questions arise in the meantime.   Louann Sjogren, DPM

## 2024-01-11 ENCOUNTER — Ambulatory Visit
Admission: EM | Admit: 2024-01-11 | Discharge: 2024-01-11 | Disposition: A | Discharge status: Home / Self Care | Payer: 59

## 2024-01-11 DIAGNOSIS — D367 Benign neoplasm of other specified sites: Secondary | ICD-10-CM

## 2024-01-11 MED ORDER — CEPHALEXIN 500 MG PO CAPS: 500.0000 mg | ORAL_CAPSULE | Freq: Two times a day (BID) | ORAL | 0 refills | Status: AC

## 2024-01-11 NOTE — ED Triage Notes (Signed)
 Pt presents with neck, shoulder, and upper arm on left side has been in pain for about a week now. Symptoms onset suddenly. No falls. No injuries. Pt states she had a cyst on back of her neck that she came to UC about. She was given an antibiotic, finished it, and it helped to make the cyst drain but now the pain she is experiencing starts at the site of the previous cyst along with intermitted headaches.

## 2024-01-11 NOTE — ED Provider Notes (Signed)
 [name]    CSN: [id number] Arrival date & time: 01/11/24  1328      History   Chief Complaint Chief Complaint  Patient presents with   Neck Pain    HPI Cheryl Mckee is a 68 y.o. female.  Patient with a history of type 2 diabetes, fibromyalgia, hypertension is here today with neck pain related to a chronic neck cyst that she was recently treated with antibiotics for after being seen here at [facility]. Patient reports while taking antibiotics, the neck pain resolved but immediately returned after she completed her last dose of antibiotics. She was advised to follow-up with [facility] surgery but reports she contact them as she was wanting to complete the antibiotic therapy first.  Reports the cyst has been present for several years and has been infected multiple times.  She has not had fever or chills.   Past Medical History:  Diagnosis Date   Coronary artery disease    Diabetes mellitus without complication (HCC)    type 2   Fibromyalgia    Hypertension    Sleep apnea    could not tolerate cpap, sleep study 10 yrs ago   Urethral diverticulum 2015   evaluated by Dr. Ottlin   Urinary incontinence     Patient Active Problem List   Diagnosis Date Noted   Exertional dyspnea 11/10/2020   Morbid obesity (HCC) 06/15/2014   Obstructive sleep apnea 06/15/2014   Noncompliance 06/15/2014   Diabetes mellitus without complication (HCC)    Depression    Hypertension    Cirrhosis, biliary (HCC)    Fibromyalgia     Past Surgical History:  Procedure Laterality Date   CARDIAC CATHETERIZATION  2001   nonobstructive cad   COLONOSCOPY  10/05/12   CYSTOSCOPY  03/07/2020   Procedure: CYSTOSCOPY;  Surgeon: Cam Morene ORN, MD;  Location: [facility];  Service: Urology;;   DILATATION & CURETTAGE/HYSTEROSCOPY WITH MYOSURE N/A 09/27/2015   Procedure: DILATATION & CURETTAGE/HYSTEROSCOPY WITH MYOSURE;  Surgeon: Evalene SHAUNNA Organ, MD;  Location: [facility];   Service: Gynecology;  Laterality: N/A;   FOOT SURGERY Left    KNEE SURGERY Left    meniscus repair   URETHRAL DIVERTICULUM REPAIR N/A 03/07/2020   Procedure: TRANSVAGINAL URETHRAL DIVERTICULECTOMY;  Surgeon: Cam Morene ORN, MD;  Location: [facility];  Service: Urology;  Laterality: N/A;    OB History     Gravida  0   Para      Term      Preterm      AB      Living         SAB      IAB      Ectopic      Multiple      Live Births               Home Medications    Prior to Admission medications   Medication Sig Start Date End Date Taking? Authorizing Provider  amLODipine  (NORVASC ) 5 MG tablet Take 5 mg by mouth daily. 01/01/24  Yes [provider]  acetaminophen  (TYLENOL ) 500 MG tablet Take 1,000 mg by mouth every 6 (six) hours as needed for headache (pain).     [provider]  amLODipine  (NORVASC ) 10 MG tablet Take 1 tablet (10 mg total) by mouth daily. 12/25/23   Raspet, Rocky POUR, PA-C  cephALEXin  (KEFLEX ) 500 MG capsule Take 1 capsule (500 mg total) by mouth 2 (two) times daily for  10 days. 01/11/24 01/21/24  Arloa Suzen RAMAN, NP  COLLAGEN PO Take 1 tablet by mouth daily.    [provider]  Continuous Glucose Sensor (DEXCOM G7 SENSOR) MISC USE 1 SENSOR EVERY 10 DAYS 12/07/23   [provider]  fluticasone  (FLONASE ) 50 MCG/ACT nasal spray Place 1 spray into both nostrils daily. 12/05/22   Hazen Darryle BRAVO, FNP  furosemide  (LASIX ) 20 MG tablet Take 20 mg by mouth every morning. 11/10/23   [provider]  guanFACINE (TENEX) 2 MG tablet Take 2 mg by mouth at bedtime. 11/24/23   [provider]  insulin  aspart (NOVOLOG  FLEXPEN) 100 UNIT/ML FlexPen Inject 13-55 Units into the skin See admin instructions. Inject 25 units subcutaneously before breakfast, 13 units before lunch and 55 units before supper    [provider]  insulin  degludec (TRESIBA  FLEXTOUCH) 200 UNIT/ML FlexTouch Pen Inject 100  Units into the skin daily before breakfast.    [provider]  ketoconazole  (NIZORAL ) 2 % cream APPLY 1 APPLICATION TOPICALLY 2 (TWO) TIMES DAILY. APPLY THIN LAYER BETWEEN TOES 07/13/23   Standiford, Marsa FALCON, DPM  ketoconazole  (NIZORAL ) 2 % cream Apply 1 Application topically daily. 12/27/23   Joya Stabs, DPM  LAXATIVE 5 MG EC tablet Take 5 mg by mouth daily as needed. 09/20/23   [provider]  levothyroxine (SYNTHROID) 88 MCG tablet Take 88 mcg by mouth every morning. 01/30/22   [provider]  loperamide  (IMODIUM ) 2 MG capsule Take 1 capsule (2 mg total) by mouth every 6 (six) hours as needed for diarrhea or loose stools. 11/12/20   Cheryle Page, MD  metFORMIN  (GLUCOPHAGE -XR) 500 MG 24 hr tablet Take 1,000 mg by mouth 2 (two) times daily with a meal.    [provider]  metoprolol  succinate (TOPROL -XL) 25 MG 24 hr tablet Take 25 mg by mouth daily. 12/08/21   [provider]  Multiple Vitamin (MULTIVITAMIN WITH MINERALS) TABS tablet Take 1 tablet by mouth daily.    [provider]  Multiple Vitamins-Minerals (EYE VITAMINS PO) Take 2 capsules by mouth daily.    [provider]  nystatin  (MYCOSTATIN /NYSTOP ) powder Apply 1 application topically daily as needed (rash (skin folds)).    [provider]  ondansetron  (ZOFRAN ) 4 MG tablet Take 1 tablet (4 mg total) by mouth every 8 (eight) hours as needed for nausea or vomiting. 03/31/22   Joya Stabs, DPM  oxyCODONE  (ROXICODONE ) 5 MG immediate release tablet Take 1 tablet (5 mg total) by mouth every 4 (four) hours as needed for severe pain. 04/08/22   Sikora, Rebecca, DPM  OZEMPIC , 2 MG/DOSE, 8 MG/3ML SOPN Inject 2 mg into the skin once a week. 12/09/23   [provider]  polyethylene glycol-electrolytes (NULYTELY) 420 g solution Take 4,000 mLs by mouth See admin instructions. 09/20/23   [provider]  Semaglutide , 1 MG/DOSE, (OZEMPIC , 1 MG/DOSE,) 2 MG/1.5ML  SOPN Inject 1 mg into the skin every Wednesday.    [provider]  telmisartan (MICARDIS) 80 MG tablet Take 80 mg by mouth daily. 01/30/22   [provider]  terbinafine  (LAMISIL ) 250 MG tablet Take 1 tablet (250 mg total) by mouth daily. 12/30/22   McDonald, Juliene SAUNDERS, DPM  traZODone  (DESYREL ) 50 MG tablet Take 50 mg by mouth at bedtime as needed for sleep.     [provider]    Family History Family History  Problem Relation Age of Onset   Diabetes Mother    Hypertension Mother  Diabetes Father    Cancer Father        Deceased, 70s--Unknown type cancer   Cancer Maternal Aunt        Ovarian or Uterine   Breast cancer Cousin        Maternal 1st cousin-Age 24    Social History Social History   Tobacco Use   Smoking status: Never   Smokeless tobacco: Never  Vaping Use   Vaping status: Never Used  Substance Use Topics   Alcohol use: No    Alcohol/week: 0.0 standard drinks of alcohol   Drug use: No     Allergies   Other, Accupril [quinapril hcl], Daypro [oxaprozin], and Zithromax [azithromycin]   Review of Systems Review of Systems  Musculoskeletal:  Positive for neck pain.     Physical Exam Triage Vital Signs ED Triage Vitals  Encounter Vitals Group     BP 01/11/24 1547 (!) 178/68     Systolic BP Percentile --      Diastolic BP Percentile --      Pulse Rate 01/11/24 1547 79     Resp 01/11/24 1547 20     Temp 01/11/24 1547 (!) 97.5 F (36.4 C)     Temp Source 01/11/24 1547 Oral     SpO2 01/11/24 1547 95 %     Weight --      Height --      Head Circumference --      Peak Flow --      Pain Score 01/11/24 1545 10     Pain Loc --      Pain Education --      Exclude from Growth Chart --    No data found.  Updated Vital Signs BP (!) 178/68 (BP Location: Right Arm)   Pulse 79   Temp (!) 97.5 F (36.4 C) (Oral)   Resp 20   LMP 08/07/2014   SpO2 95%   Visual Acuity Right Eye Distance:   Left Eye Distance:   Bilateral  Distance:    Right Eye Near:   Left Eye Near:    Bilateral Near:     Physical Exam Vitals reviewed.  Constitutional:      Appearance: She is obese.  HENT:     Head: Normocephalic and atraumatic.  Neck:   Cardiovascular:     Rate and Rhythm: Normal rate and regular rhythm.  Pulmonary:     Effort: Pulmonary effort is normal.     Breath sounds: Normal breath sounds.  Neurological:     Mental Status: She is alert.      UC Treatments / Results  Labs (all labs ordered are listed, but only abnormal results are displayed) Labs Reviewed - No data to display  EKG   Radiology No results found.  Procedures Procedures (including critical care time)  Medications Ordered in UC Medications - No data to display  Initial Impression / Assessment and Plan / UC Course  I have reviewed the triage vital signs and the nursing notes.  Pertinent labs & imaging results that were available during my care of the patient were reviewed by me and considered in my medical decision making (see chart for details).    Continues to have significant tenderness on exam visible dried drainage present will restart Keflex  and strongly encouraged patient to contact [facility] as given cyst remains painful and tender with induration. Patient relies understanding to contact [facility].   Final Clinical Impressions(s) / UC Diagnoses   Final  diagnoses:  Dermoid cyst of neck   Discharge Instructions   None    ED Prescriptions     Medication Sig Dispense Auth. Provider   cephALEXin  (KEFLEX ) 500 MG capsule Take 1 capsule (500 mg total) by mouth 2 (two) times daily for 10 days. 20 capsule Arloa Suzen RAMAN, NP      PDMP not reviewed this encounter.   Arloa Suzen RAMAN, NP 01/11/24 1640

## 2024-01-18 ENCOUNTER — Other Ambulatory Visit: Payer: Self-pay | Admitting: Surgery

## 2024-01-19 LAB — SURGICAL PATHOLOGY

## 2024-01-19 IMAGING — MR MR TOES*L* WO/W CM
8 series · 36 of 40 positions shown · IV contrast (Multihance 20cc)
Comparison: Left foot x-rays dated March 18, 2022.

CLINICAL DATA: Nonhealing third toe ulcer.

EXAM:
MRI OF THE LEFT TOES WITHOUT AND WITH CONTRAST
TECHNIQUE: Multiplanar, multisequence MR imaging of the left forefoot was
performed both before and after administration of intravenous
contrast.
CONTRAST:  20mL MULTIHANCE GADOBENATE DIMEGLUMINE 529 MG/ML IV SOLN

[Series 4: T1 · coronal · left · 3.0mm · 0.31mm/px · 7 of 40 slices shown (1 of 2)]
[im 1/40]
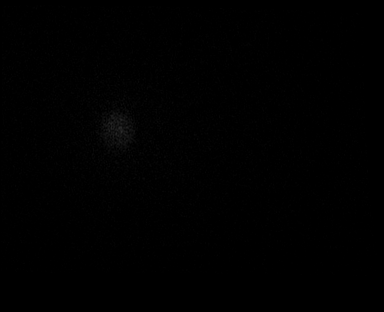
[im 7/40]
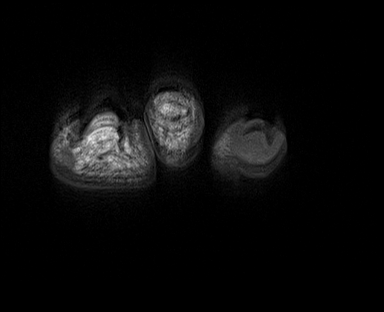
[im 14/40]
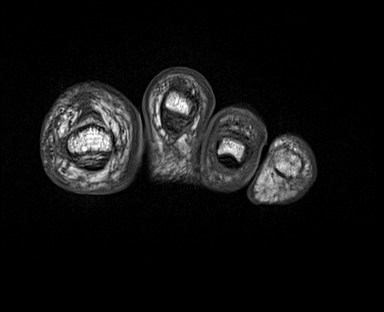
[im 20/40]
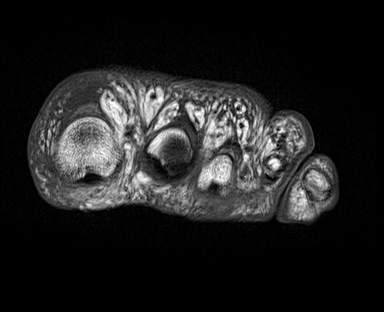
[im 27/40]
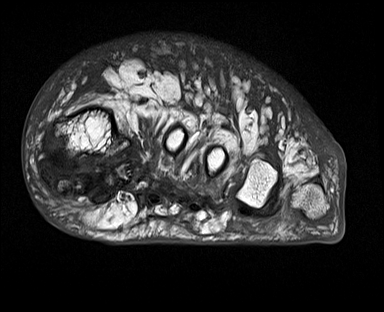
[im 33/40]
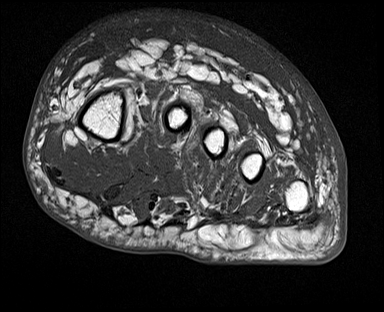
[im 40/40]
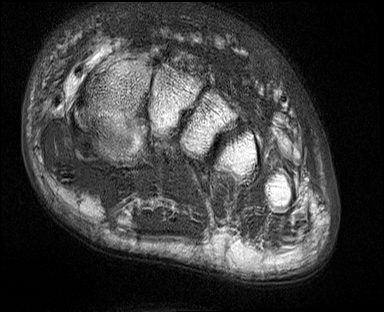

[Series 5: T2 fat-sat · coronal · left · 3.0mm · 0.31mm/px · 7 of 40 slices shown (1 of 2)]
[im 1/40]
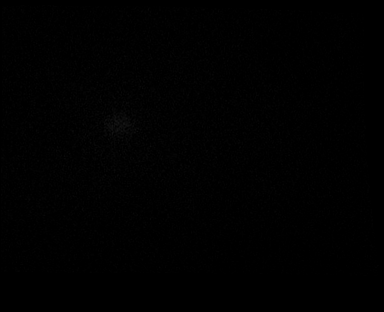
[im 7/40]
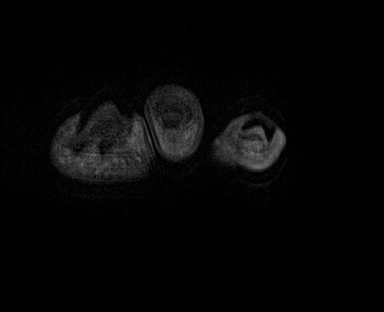
[im 14/40]
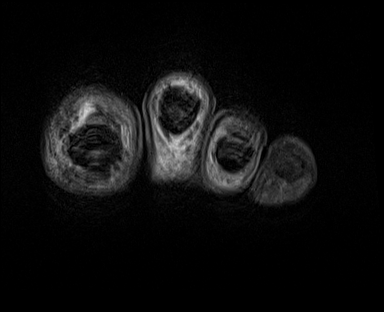
[im 20/40]
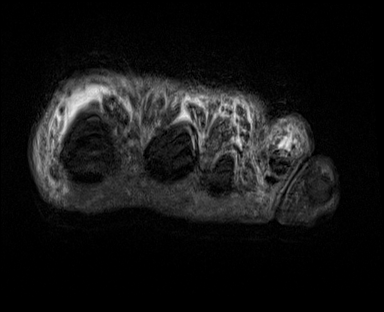
[im 27/40]
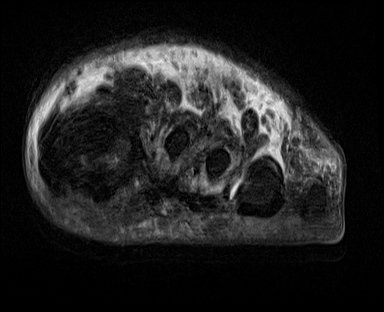
[im 33/40]
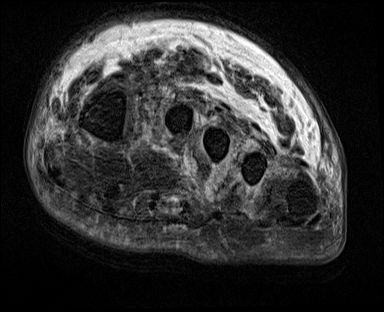
[im 40/40]
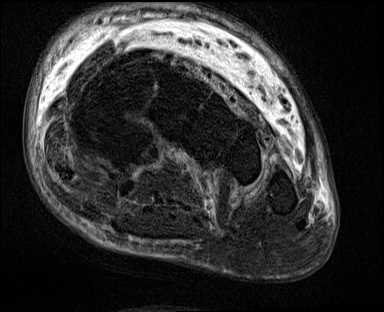

[Series 6: T2 fat-sat · axial · left · 3.0mm · 0.47mm/px · z∈[-126,-51]mm · 3 of 23 slices shown (2 of 2)]
[im 1/23]
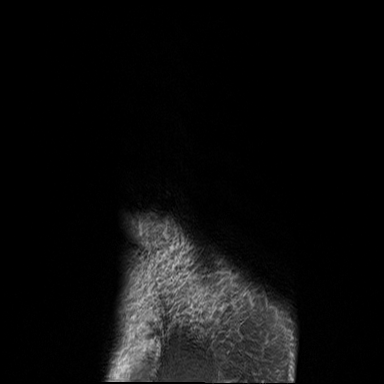
[im 12/23]
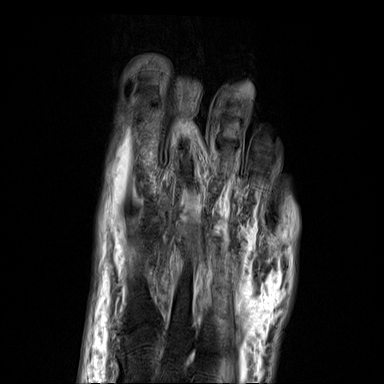
[im 23/23]
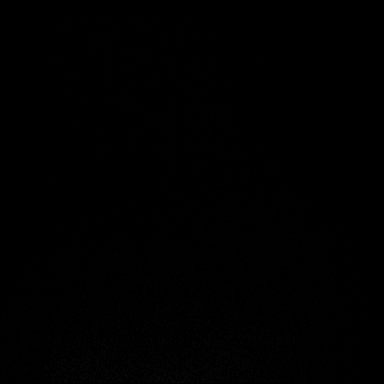

[Series 7: T1 · axial · left · 3.0mm · 0.47mm/px · z∈[-126,-51]mm · 3 of 23 slices shown (2 of 2)]
[im 1/23]
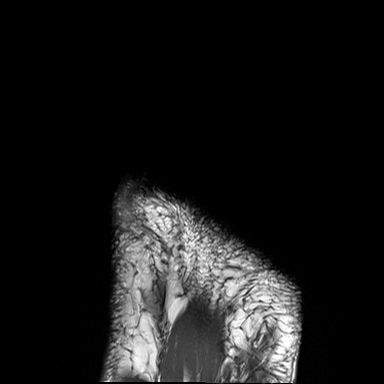
[im 12/23]
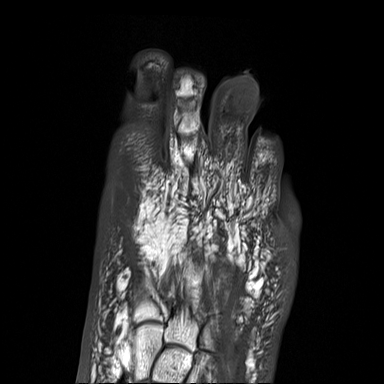
[im 23/23]
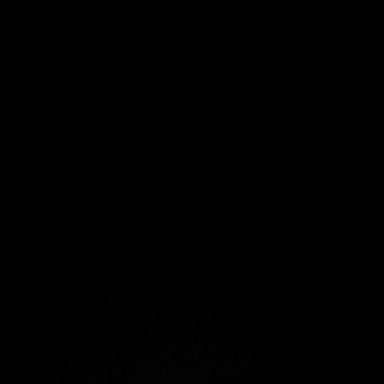

[Series 8: STIR · sagittal · left · 3.0mm · 0.70mm/px · 1 of 34 slices shown]
[im 1/34]
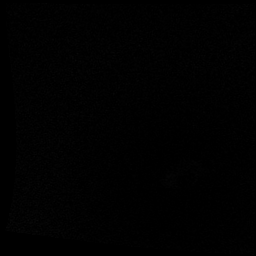

[Series 9: T1 fat-sat · coronal · non-contrast · left · 3.0mm · 0.27mm/px · 6 of 40 slices shown (1 of 2)]
[im 1/40]
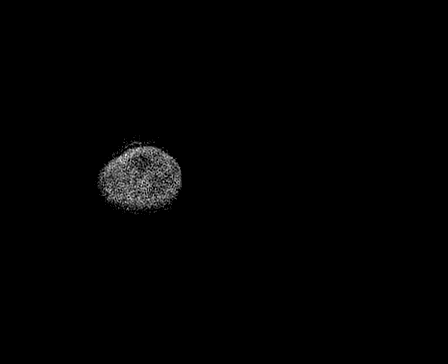
[im 8/40]
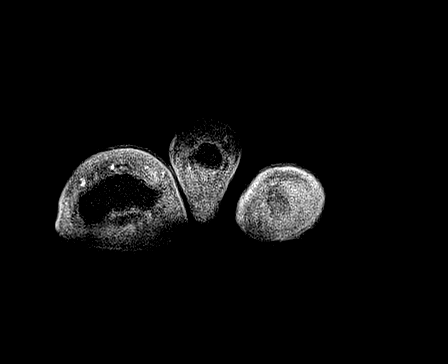
[im 16/40]
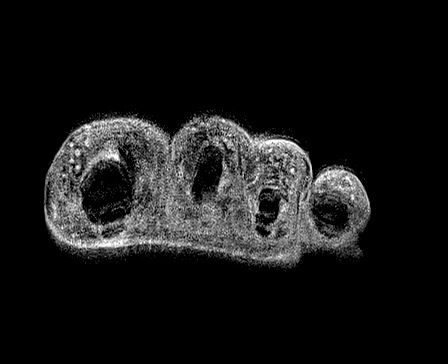
[im 24/40]
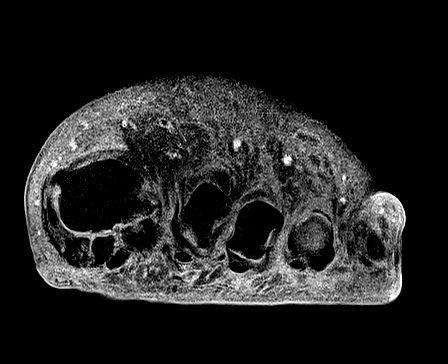
[im 32/40]
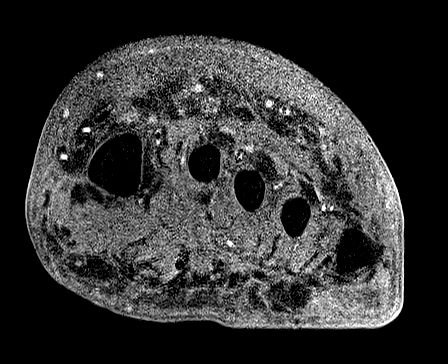
[im 40/40]
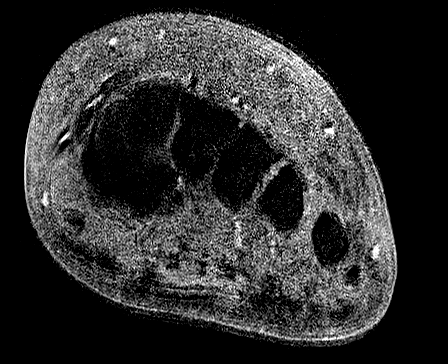

[Series 10: T1 fat-sat post-contrast · coronal · left · 3.0mm · 0.27mm/px · 6 of 40 slices shown]
[im 1/40]
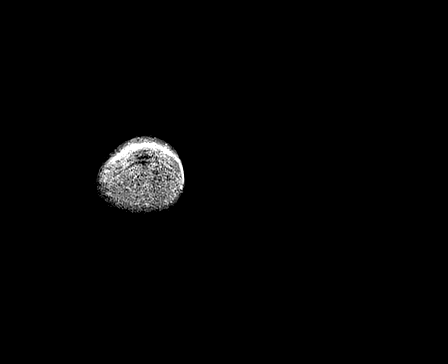
[im 8/40]
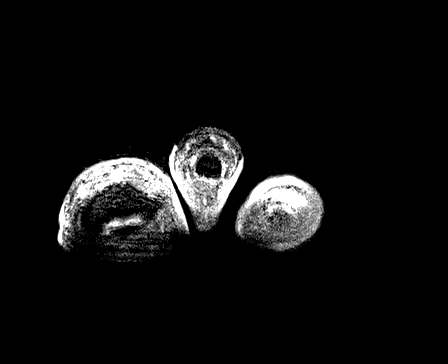
[im 16/40]
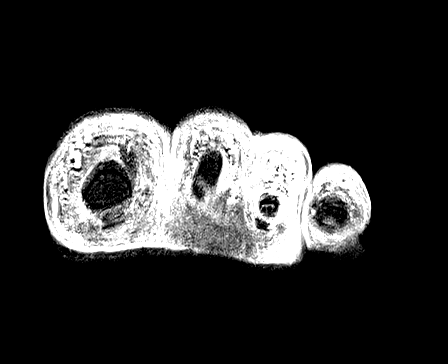
[im 24/40]
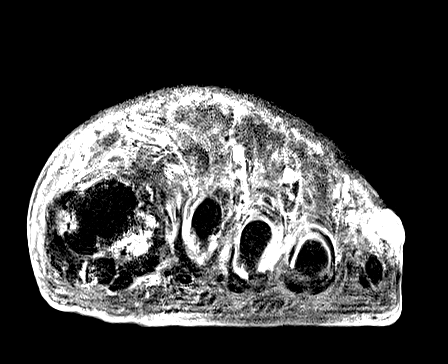
[im 32/40]
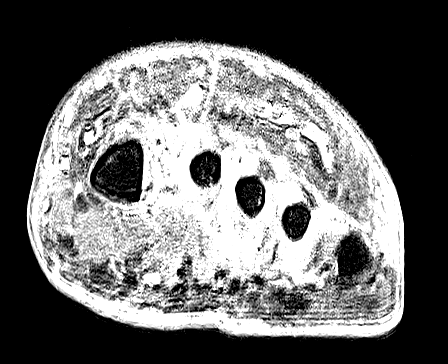
[im 40/40]
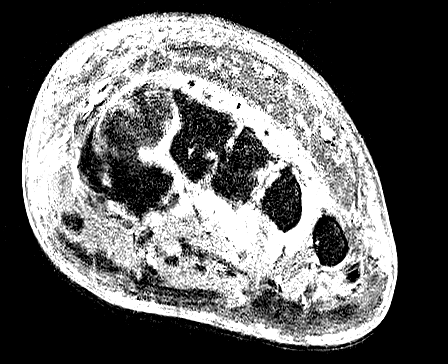

[Series 11: T1 fat-sat · axial · left · 3.0mm · 0.47mm/px · z∈[-126,-51]mm · 3 of 23 slices shown (2 of 2)]
[im 1/23]
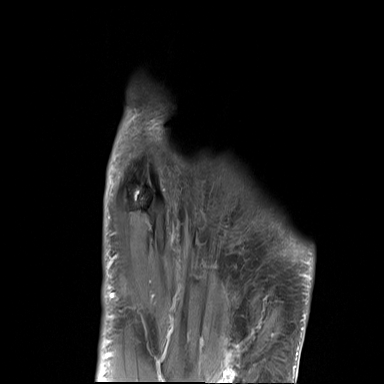
[im 12/23]
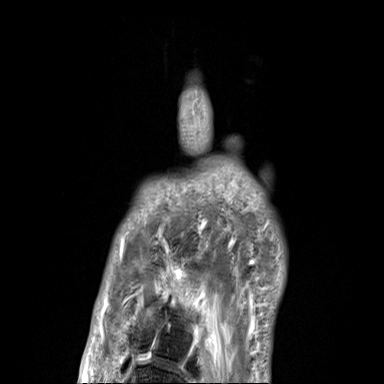
[im 23/23]
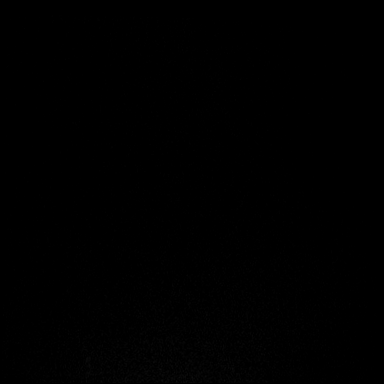

[36 of 40 positions shown; findings below may reference images not displayed]

FINDINGS: Bones/Joint/Cartilage

Abnormal marrow edema and enhancement with corresponding decreased
T1 marrow signal involving the third distal phalanx, and to a lesser
extent the third middle phalanx. Bony destruction of the third
distal phalanx. No fracture or dislocation. Mild osteoarthritis of
the first TMT joint, first MTP joint, and first IP joint. No joint
effusion.

Ligaments

Collateral ligaments are intact.

Muscles and Tendons
Flexor and extensor tendons are intact. Increased T2 signal within
the intrinsic muscles of the forefoot, nonspecific, but likely
related to diabetic muscle changes.

Soft tissue
Soft tissue ulceration at the tip of the third toe. Prominent dorsal
forefoot soft tissue swelling extending into the toes. No fluid
collection. No soft tissue mass.
IMPRESSION: 1. Soft tissue ulceration at the tip of the third toe with
underlying osteomyelitis of the third distal and middle phalanges.
No abscess.

## 2024-03-29 ENCOUNTER — Ambulatory Visit: Payer: 59 | Admitting: Podiatry

## 2024-03-29 DIAGNOSIS — M79674 Pain in right toe(s): Secondary | ICD-10-CM | POA: Diagnosis not present

## 2024-03-29 DIAGNOSIS — B351 Tinea unguium: Secondary | ICD-10-CM | POA: Diagnosis not present

## 2024-03-29 DIAGNOSIS — M79675 Pain in left toe(s): Secondary | ICD-10-CM

## 2024-03-29 DIAGNOSIS — E1142 Type 2 diabetes mellitus with diabetic polyneuropathy: Secondary | ICD-10-CM | POA: Diagnosis not present

## 2024-03-29 NOTE — Progress Notes (Signed)
  Subjective:  Patient ID: Cheryl Mckee, female    DOB: [birth year],   MRN: [id number]  Chief Complaint  Patient presents with   Nail Problem    RFC    68 y.o. female presents for concern of thickened elongated and painful nails that are difficult to trim. Requesting to have them trimmed today. Relates burning and tingling in their feet. 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   Patient is diabetic and last A1c was  Lab Results  Component Value Date   HGBA1C 9.0 (H) 11/11/2020   .   PCP:  Virgle Grime, MD    . Denies any other pedal complaints. Denies n/v/f/c.   Past Medical History:  Diagnosis Date   Coronary artery disease    Diabetes mellitus without complication (HCC)    type 2   Fibromyalgia    Hypertension    Sleep apnea    could not tolerate cpap, sleep study 10 yrs ago   Urethral diverticulum 2015   evaluated by Dr. Guinevere Lefevre   Urinary incontinence     Objective:  Physical Exam: Vascular: DP/PT pulses 2/4 bilateral. CFT <3 seconds. Absent hair growth on digits. Edema noted to bilateral lower extremities. Xerosis noted bilaterally.  Skin. No lacerations or abrasions bilateral feet. Nails 1-5 bilateral  are thickened discolored and elongated with subungual debris. Hyperkeratotic lesion noted to medial bilateral hallux. Maceration noted in bilateral first third and fourth interspaces. Improved on right but still present on left.  Musculoskeletal: MMT 5/5 bilateral lower extremities in DF, PF, Inversion and Eversion. Deceased ROM in DF of ankle joint. Amputation of left second digit.  Neurological: Sensation intact to light touch. Protective sensation diminished bilateral.    Assessment:   1. Pain due to onychomycosis of toenails of both feet   2. Type 2 diabetes mellitus with peripheral neuropathy (HCC)          Plan:  Patient was evaluated and treated and all questions answered. -Discussed and educated patient on diabetic foot care,  especially with  regards to the vascular, neurological and musculoskeletal systems.  -Stressed the importance of good glycemic control and the detriment of not  controlling glucose levels in relation to the foot. -Discussed supportive shoes at all times and checking feet regularly.  -Mechanically debrided all nails 1-5 bilateral using sterile nail nipper and filed with dremel without incident  -Maceration noted to left first third and fourth interspaced. Betadine applied and advised on care. -Advised to keep betadine in between affected areas in between toes.  -Answered all patient questions -Patient to return  in 3 months for at risk foot care -Patient advised to call the office if any problems or questions arise in the meantime.   Jennefer Moats, DPM

## 2024-06-28 ENCOUNTER — Encounter: Payer: Self-pay | Admitting: Podiatry

## 2024-06-28 ENCOUNTER — Ambulatory Visit: Admitting: Podiatry

## 2024-06-28 DIAGNOSIS — M79674 Pain in right toe(s): Secondary | ICD-10-CM | POA: Diagnosis not present

## 2024-06-28 DIAGNOSIS — E1142 Type 2 diabetes mellitus with diabetic polyneuropathy: Secondary | ICD-10-CM | POA: Diagnosis not present

## 2024-06-28 DIAGNOSIS — B351 Tinea unguium: Secondary | ICD-10-CM | POA: Diagnosis not present

## 2024-06-28 DIAGNOSIS — M79675 Pain in left toe(s): Secondary | ICD-10-CM | POA: Diagnosis not present

## 2024-06-28 NOTE — Progress Notes (Signed)
  Subjective:  Patient ID: Cheryl Mckee, female    DOB: [birth year],   MRN: [id number]  Chief Complaint  Patient presents with   Diabetes    Clip my toenails and inspect my feet.  Harlene Bill - will see next week; A1c - 9.0    68 y.o. female presents for concern of thickened elongated and painful nails that are difficult to trim. Requesting to have them trimmed today. Relates burning and tingling in their feet. 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   Patient is diabetic and last A1c was  Lab Results  Component Value Date   HGBA1C 9.0 (H) 11/11/2020   .   PCP:  Verdia Lombard, MD    . Denies any other pedal complaints. Denies n/v/f/c.   Past Medical History:  Diagnosis Date   Coronary artery disease    Diabetes mellitus without complication (HCC)    type 2   Fibromyalgia    Hypertension    Sleep apnea    could not tolerate cpap, sleep study 10 yrs ago   Urethral diverticulum 2015   evaluated by Dr. Montie   Urinary incontinence     Objective:  Physical Exam: Vascular: DP/PT pulses 2/4 bilateral. CFT <3 seconds. Absent hair growth on digits. Edema noted to bilateral lower extremities. Xerosis noted bilaterally.  Skin. No lacerations or abrasions bilateral feet. Nails 1-5 bilateral  are thickened discolored and elongated with subungual debris. Hyperkeratotic lesion noted to medial bilateral hallux. Maceration noted in bilateral first third and fourth interspaces. Improved on right but still present on left.  Musculoskeletal: MMT 5/5 bilateral lower extremities in DF, PF, Inversion and Eversion. Deceased ROM in DF of ankle joint. Amputation of left second digit.  Neurological: Sensation intact to light touch. Protective sensation diminished bilateral.    Assessment:   1. Pain due to onychomycosis of toenails of both feet   2. Type 2 diabetes mellitus with peripheral neuropathy (HCC)          Plan:  Patient was evaluated and treated and all  questions answered. -Discussed and educated patient on diabetic foot care, especially with  regards to the vascular, neurological and musculoskeletal systems.  -Stressed the importance of good glycemic control and the detriment of not  controlling glucose levels in relation to the foot. -Discussed supportive shoes at all times and checking feet regularly.  -Mechanically debrided all nails 1-5 bilateral using sterile nail nipper and filed with dremel without incident  -Maceration noted to left first third and fourth interspaced. Betadine applied and advised on care. -Advised to keep betadine in between affected areas in between toes.  -Answered all patient questions -Patient to return  in 3 months for at risk foot care -Patient advised to call the office if any problems or questions arise in the meantime.   Asberry Failing, DPM

## 2024-08-07 ENCOUNTER — Other Ambulatory Visit: Payer: Self-pay

## 2024-08-07 ENCOUNTER — Emergency Department (HOSPITAL_COMMUNITY)

## 2024-08-07 ENCOUNTER — Ambulatory Visit
Admission: RE | Admit: 2024-08-07 | Discharge: 2024-08-07 | Disposition: A | Discharge status: Home / Self Care | Source: Ambulatory Visit

## 2024-08-07 ENCOUNTER — Encounter (HOSPITAL_COMMUNITY): Payer: Self-pay

## 2024-08-07 ENCOUNTER — Emergency Department (HOSPITAL_COMMUNITY)
Admission: EM | Admit: 2024-08-07 | Discharge: 2024-08-08 | Disposition: A | Discharge status: Home / Self Care | Source: Other Acute Inpatient Hospital | Attending: Emergency Medicine | Admitting: Emergency Medicine

## 2024-08-07 VITALS — BP 184/78 | HR 71 | Temp 98.0°F | Resp 20

## 2024-08-07 DIAGNOSIS — Z7984 Long term (current) use of oral hypoglycemic drugs: Secondary | ICD-10-CM | POA: Insufficient documentation

## 2024-08-07 DIAGNOSIS — Z79899 Other long term (current) drug therapy: Secondary | ICD-10-CM | POA: Insufficient documentation

## 2024-08-07 DIAGNOSIS — E119 Type 2 diabetes mellitus without complications: Secondary | ICD-10-CM | POA: Diagnosis not present

## 2024-08-07 DIAGNOSIS — Z794 Long term (current) use of insulin: Secondary | ICD-10-CM | POA: Diagnosis not present

## 2024-08-07 DIAGNOSIS — R748 Abnormal levels of other serum enzymes: Secondary | ICD-10-CM | POA: Insufficient documentation

## 2024-08-07 DIAGNOSIS — R7989 Other specified abnormal findings of blood chemistry: Secondary | ICD-10-CM | POA: Diagnosis not present

## 2024-08-07 DIAGNOSIS — R0602 Shortness of breath: Secondary | ICD-10-CM | POA: Diagnosis present

## 2024-08-07 DIAGNOSIS — J189 Pneumonia, unspecified organism: Secondary | ICD-10-CM | POA: Insufficient documentation

## 2024-08-07 DIAGNOSIS — R0601 Orthopnea: Secondary | ICD-10-CM

## 2024-08-07 DIAGNOSIS — I10 Essential (primary) hypertension: Secondary | ICD-10-CM | POA: Diagnosis not present

## 2024-08-07 LAB — COMPREHENSIVE METABOLIC PANEL WITH GFR
ALT: 28 U/L (ref 0–44)
AST: 31 U/L (ref 15–41)
Albumin: 4 g/dL (ref 3.5–5.0)
Alkaline Phosphatase: 142 U/L — ABNORMAL HIGH (ref 38–126)
Anion gap: 13 (ref 5–15)
BUN: 11 mg/dL (ref 8–23)
CO2: 25 mmol/L (ref 22–32)
Calcium: 9.2 mg/dL (ref 8.9–10.3)
Chloride: 101 mmol/L (ref 98–111)
Creatinine, Ser: 0.96 mg/dL (ref 0.44–1.00)
GFR, Estimated: >60 mL/min (ref >60)
Glucose, Bld: 132 mg/dL — ABNORMAL HIGH (ref 70–99)
Potassium: 3.7 mmol/L (ref 3.5–5.1)
Sodium: 139 mmol/L (ref 135–145)
Total Bilirubin: 0.5 mg/dL (ref 0.0–1.2)
Total Protein: 7.9 g/dL (ref 6.5–8.1)

## 2024-08-07 LAB — CBC WITH DIFFERENTIAL/PLATELET
Abs Immature Granulocytes: 0.02 K/uL (ref 0.00–0.07)
Basophils Absolute: 0.1 K/uL (ref 0.0–0.1)
Basophils Relative: 1 %
Eosinophils Absolute: 0.3 K/uL (ref 0.0–0.5)
Eosinophils Relative: 2 %
HCT: 41.2 % (ref 36.0–46.0)
Hemoglobin: 12.7 g/dL (ref 12.0–15.0)
Immature Granulocytes: 0 %
Lymphocytes Relative: 31 %
Lymphs Abs: 3.6 K/uL (ref 0.7–4.0)
MCH: 25.8 pg — ABNORMAL LOW (ref 26.0–34.0)
MCHC: 30.8 g/dL (ref 30.0–36.0)
MCV: 83.6 fL (ref 80.0–100.0)
Monocytes Absolute: 0.7 K/uL (ref 0.1–1.0)
Monocytes Relative: 6 %
Neutro Abs: 7 K/uL (ref 1.7–7.7)
Neutrophils Relative %: 60 %
Platelets: 225 K/uL (ref 150–400)
RBC: 4.93 MIL/uL (ref 3.87–5.11)
RDW: 15.1 % (ref 11.5–15.5)
WBC: 11.7 K/uL — ABNORMAL HIGH (ref 4.0–10.5)
nRBC: 0 % (ref 0.0–0.2)

## 2024-08-07 LAB — TROPONIN T, HIGH SENSITIVITY: Troponin T High Sensitivity: 61 ng/L — ABNORMAL HIGH (ref 0–19)

## 2024-08-07 LAB — RESP PANEL BY RT-PCR (RSV, FLU A&B, COVID)  RVPGX2
Influenza A by PCR: NEGATIVE
Influenza B by PCR: NEGATIVE
Resp Syncytial Virus by PCR: NEGATIVE
SARS Coronavirus 2 by RT PCR: NEGATIVE

## 2024-08-07 LAB — PRO BRAIN NATRIURETIC PEPTIDE: Pro Brain Natriuretic Peptide: 429 pg/mL — ABNORMAL HIGH (ref <300.0)

## 2024-08-07 MED ORDER — HYDRALAZINE HCL 20 MG/ML IJ SOLN
10.0000 mg | Freq: Once | INTRAMUSCULAR | Status: AC
  Administered 2024-08-07: 10 mg via INTRAVENOUS
  Filled 2024-08-07: qty 1

## 2024-08-07 MED ORDER — IOHEXOL 350 MG/ML SOLN
100.0000 mL | Freq: Once | INTRAVENOUS | Status: AC | PRN
  Administered 2024-08-07: 85 mL via INTRAVENOUS

## 2024-08-07 NOTE — ED Notes (Signed)
 Patient is being discharged from the Urgent Care and sent to the Emergency Department via POV . Per Asberry Senters, PA-C, patient is in need of higher level of care due to orthopnea. Patient is aware and verbalizes understanding of plan of care.  Vitals:   08/07/24 1658 08/07/24 1703  BP: (!) 195/79 (!) 184/78  Pulse: 74 71  Resp: 20   Temp: 98 F (36.7 C)   SpO2: 97%

## 2024-08-07 NOTE — ED Triage Notes (Signed)
 Pt sent to ED from UC for SOB while laying flat. Pt reports last night she woke up and couldn't catch her breath, but was able to sit upright and felt better. Hypertensive in triage, reports missing some doses of prescribed antihypertensive. Denies BP, headache, SOB currently.

## 2024-08-07 NOTE — ED Provider Notes (Signed)
 Cheryl Mckee Provider Note   CSN: [id number] Arrival date & time: 08/07/24  1733     Patient presents with: Shortness of Breath   Cheryl Mckee is a 68 y.o. female.  {Add pertinent medical, surgical, social history, OB history to HPI:4117} 68 year old female with prior medical history as detailed below presents for evaluation.  Patient reports that she was in her normal state of health yesterday.  She went to bed.  This morning she was sleeping flat on her back.  She woke up and felt like she could not catch her breath.  She reports that this dyspnea resolved with sitting up and standing.  She denies any current complaints such as shortness of breath, chest pain, headache, increased lower extremity edema, etc.  Prior medical history includes morbid obesity, orthopnea, hypertension, diabetes, hyperlipidemia.  The history is provided by the patient and medical records.       Prior to Admission medications   Medication Sig Start Date End Date Taking? Authorizing Provider  acetaminophen  (TYLENOL ) 500 MG tablet Take 1,000 mg by mouth every 6 (six) hours as needed for headache (pain).     [provider]  amLODipine  (NORVASC ) 10 MG tablet Take 1 tablet (10 mg total) by mouth daily. 12/25/23   Raspet, Erin K, PA-C  amLODipine  (NORVASC ) 5 MG tablet Take 5 mg by mouth daily. Patient not taking: Reported on 06/28/2024 01/01/24   [provider]  atorvastatin  (LIPITOR) 40 MG tablet Take 40 mg by mouth daily. 05/21/24   [provider]  COLLAGEN PO Take 1 tablet by mouth daily.    [provider]  Continuous Glucose Sensor (DEXCOM G7 SENSOR) MISC USE 1 SENSOR EVERY 10 DAYS 12/07/23   [provider]  fluticasone  (FLONASE ) 50 MCG/ACT nasal spray Place 1 spray into both nostrils daily. Patient not taking: Reported on 08/07/2024 12/05/22   Cheryl Darryle BRAVO, FNP  furosemide  (LASIX ) 20 MG tablet Take 20 mg by mouth  every morning. 11/10/23   [provider]  guanFACINE (TENEX) 2 MG tablet Take 2 mg by mouth at bedtime. 11/24/23   [provider]  insulin  aspart (NOVOLOG  FLEXPEN) 100 UNIT/ML FlexPen Inject 13-55 Units into the skin See admin instructions. Inject 25 units subcutaneously before breakfast, 13 units before lunch and 55 units before supper    [provider]  insulin  degludec (TRESIBA  FLEXTOUCH) 200 UNIT/ML FlexTouch Pen Inject 100 Units into the skin daily before breakfast.    [provider]  ketoconazole  (NIZORAL ) 2 % cream APPLY 1 APPLICATION TOPICALLY 2 (TWO) TIMES DAILY. APPLY THIN LAYER BETWEEN TOES 07/13/23   Standiford, Marsa Mckee, DPM  ketoconazole  (NIZORAL ) 2 % cream Apply 1 Application topically daily. 12/27/23   Cheryl Mckee, DPM  LAXATIVE 5 MG EC tablet Take 5 mg by mouth daily as needed. 09/20/23   [provider]  levothyroxine (SYNTHROID) 88 MCG tablet Take 88 mcg by mouth every morning. Patient not taking: Reported on 08/07/2024 01/30/22   [provider]  loperamide  (IMODIUM ) 2 MG capsule Take 1 capsule (2 mg total) by mouth every 6 (six) hours as needed for diarrhea or loose stools. 11/12/20   Cheryl Page, MD  metFORMIN  (GLUCOPHAGE -XR) 500 MG 24 hr tablet Take 1,000 mg by mouth 2 (two) times daily with a meal.    [provider]  metoprolol  succinate (TOPROL -XL) 25 MG 24 hr tablet Take 25 mg by mouth daily. 12/08/21   [provider]  Multiple Vitamin (MULTIVITAMIN WITH MINERALS) TABS tablet Take 1 tablet by mouth daily.    [provider]  Multiple Vitamins-Minerals (EYE VITAMINS PO) Take 2 capsules by mouth daily.    [provider]  nystatin  (MYCOSTATIN /NYSTOP ) powder Apply 1 application topically daily as needed (rash (skin folds)).    [provider]  ondansetron  (ZOFRAN ) 4 MG tablet Take 1 tablet (4 mg total) by mouth every 8 (eight) hours as needed for nausea or vomiting. Patient  not taking: Reported on 06/28/2024 03/31/22   Sikora, Rebecca, DPM  oxyCODONE  (ROXICODONE ) 5 MG immediate release tablet Take 1 tablet (5 mg total) by mouth every 4 (four) hours as needed for severe pain. Patient not taking: Reported on 08/07/2024 04/08/22   Sikora, Rebecca, DPM  OZEMPIC , 2 MG/DOSE, 8 MG/3ML SOPN Inject 2 mg into the skin once a week. 12/09/23   [provider]  polyethylene glycol-electrolytes (NULYTELY) 420 g solution Take 4,000 mLs by mouth See admin instructions. Patient not taking: Reported on 08/07/2024 09/20/23   [provider]  Semaglutide , 1 MG/DOSE, (OZEMPIC , 1 MG/DOSE,) 2 MG/1.5ML SOPN Inject 1 mg into the skin every Wednesday.    [provider]  telmisartan (MICARDIS) 80 MG tablet Take 80 mg by mouth daily. 01/30/22   [provider]  terbinafine  (LAMISIL ) 250 MG tablet Take 1 tablet (250 mg total) by mouth daily. Patient not taking: Reported on 06/28/2024 12/30/22   Cheryl Mckee, DPM  traZODone  (DESYREL ) 50 MG tablet Take 50 mg by mouth at bedtime as needed for sleep.     [provider]    Allergies: Other, Accupril [quinapril hcl], Daypro [oxaprozin], and Zithromax [azithromycin]    Review of Systems  All other systems reviewed and are negative.   Updated Vital Signs BP (!) 214/91 (BP Location: Right Arm)   Pulse 84   Temp 98 F (36.7 C) (Oral)   Resp 16   Ht 5' 9 (1.753 m)   Wt 136.1 kg   LMP 08/07/2014   SpO2 93%   BMI 44.30 kg/m   Physical Exam Vitals and nursing note reviewed.  Constitutional:      General: She is not in acute distress.    Appearance: Normal appearance. She is well-developed.  HENT:     Head: Normocephalic and atraumatic.  Eyes:     Conjunctiva/sclera: Conjunctivae normal.     Pupils: Pupils are equal, round, and reactive to light.  Cardiovascular:     Rate and Rhythm: Normal rate and regular rhythm.     Heart sounds: Normal heart sounds.  Pulmonary:     Effort: Pulmonary effort  is normal. No respiratory distress.     Breath sounds: Normal breath sounds.  Abdominal:     General: There is no distension.     Palpations: Abdomen is soft.     Tenderness: There is no abdominal tenderness.  Musculoskeletal:        General: No deformity. Normal range of motion.     Cervical back: Normal range of motion and neck supple.     Right lower leg: Edema present.     Left lower leg: Edema present.  Skin:    General: Skin is warm and dry.  Neurological:     General: No focal deficit present.     Mental Status: She is alert and oriented to person, place, and time.     (all labs ordered are listed, but only abnormal results are displayed) Labs Reviewed  CBC WITH DIFFERENTIAL/PLATELET -  Abnormal; Notable for the following components:      Result Value   WBC 11.7 (*)    MCH 25.8 (*)    All other components within normal limits  RESP PANEL BY RT-PCR (RSV, FLU A&B, COVID)  RVPGX2  PRO BRAIN NATRIURETIC PEPTIDE  COMPREHENSIVE METABOLIC PANEL WITH GFR  URINALYSIS, W/ REFLEX TO CULTURE (INFECTION SUSPECTED)  TROPONIN T, HIGH SENSITIVITY    EKG: None  Radiology: No results found.  {Document cardiac monitor, telemetry assessment procedure when appropriate:32947} Procedures   Medications Ordered in the ED - No data to display    {Click here for ABCD2, HEART and other calculators REFRESH Note before signing:1}                              Medical Decision Making Patient presents with complaint of dyspnea that developed overnight while she was lying flat.  Being upright and ambulating improved her symptoms.  She is currently without complaint on evaluation here in the ED.  Initial EKG is without specific acute ischemic changes.  Initial troponin is 61.  Repeat troponin pending.  CTA does not demonstrate PE.  Findings are concerning for possible early bronchopneumonia.  Oncoming EDP Auther) aware of case.     Amount and/or Complexity of Data Reviewed Labs:  ordered. Radiology: ordered.  Risk Prescription drug management.   ***  {Document critical care time when appropriate  Document review of labs and clinical decision tools ie CHADS2VASC2, etc  Document your independent review of radiology images and any outside records  Document your discussion with family members, caretakers and with consultants  Document social determinants of health affecting pt's care  Document your decision making why or why not admission, treatments were needed:32947:::1}   Final diagnoses:  None    ED Discharge Orders     None

## 2024-08-07 NOTE — ED Provider Notes (Signed)
 Care assumed from Dr. Laurice, patient with shortness of breath with orthopnea. CTA chest shows possible pneumonia. Initial troponin is mildly elevated. She has repeat troponin pending. She may need admission.

## 2024-08-07 NOTE — ED Triage Notes (Addendum)
 When I lay down I began to start wheezing and can't breathe.  I have to set up to breathe but the wheezing continues - Entered by patient  States last night when she laid down to sleep she started wheezing. Symptoms improved when she sat up. States every time she tries to lie down to sleep she feels like she can't breathe. Slight cough this afternoon once. Nasal drip when she lies down. Endorses headache. Denies increased SOB with ambulation.

## 2024-08-07 NOTE — ED Provider Notes (Signed)
 Patient presents today for evaluation of wheezing and difficulty breathing when she lies down.  She reports that when she sits up she does have some improvement of this.  She has had swelling in her lower legs for some time, this is not new.  Recommended further evaluation in the emergency room for stat labs and imaging.  Patient is agreeable to same and feels stable to transport herself to ED.   Billy Asberry FALCON, PA-C 08/07/24 1710

## 2024-08-07 NOTE — ED Notes (Signed)
 Pt to CT

## 2024-08-08 LAB — URINALYSIS, W/ REFLEX TO CULTURE (INFECTION SUSPECTED)
Bacteria, UA: NONE SEEN
Bilirubin Urine: NEGATIVE
Glucose, UA: NEGATIVE mg/dL
Hgb urine dipstick: NEGATIVE
Ketones, ur: NEGATIVE mg/dL
Leukocytes,Ua: NEGATIVE
Nitrite: NEGATIVE
Protein, ur: NEGATIVE mg/dL
Specific Gravity, Urine: 1.015 (ref 1.005–1.030)
pH: 7 (ref 5.0–8.0)

## 2024-08-08 LAB — TROPONIN T, HIGH SENSITIVITY: Troponin T High Sensitivity: 63 ng/L — ABNORMAL HIGH (ref 0–19)

## 2024-08-08 MED ORDER — PREDNISONE 50 MG PO TABS: 50.0000 mg | ORAL_TABLET | Freq: Every day | ORAL | 0 refills | Status: AC

## 2024-08-08 MED ORDER — DOXYCYCLINE HYCLATE 100 MG PO CAPS
100.0000 mg | ORAL_CAPSULE | Freq: Two times a day (BID) | ORAL | 0 refills | Status: AC
Start: 2024-08-08 — End: ?

## 2024-08-08 MED ORDER — ALBUTEROL SULFATE HFA 108 (90 BASE) MCG/ACT IN AERS: 2.0000 | INHALATION_SPRAY | RESPIRATORY_TRACT | Status: AC | PRN

## 2024-08-08 MED ORDER — DOXYCYCLINE HYCLATE 100 MG PO TABS
100.0000 mg | ORAL_TABLET | Freq: Once | ORAL | Status: AC
  Administered 2024-08-08: 100 mg via ORAL
  Filled 2024-08-08: qty 1

## 2024-08-08 MED ORDER — SODIUM CHLORIDE 0.9 % IV SOLN
2.0000 g | Freq: Once | INTRAVENOUS | Status: AC
  Administered 2024-08-08: 2 g via INTRAVENOUS
  Filled 2024-08-08: qty 20

## 2024-08-08 MED ORDER — PREDNISONE 20 MG PO TABS
60.0000 mg | ORAL_TABLET | Freq: Once | ORAL | Status: AC
  Administered 2024-08-08: 60 mg via ORAL
  Filled 2024-08-08: qty 3

## 2024-08-08 MED ORDER — IPRATROPIUM-ALBUTEROL 0.5-2.5 (3) MG/3ML IN SOLN
3.0000 mL | Freq: Once | RESPIRATORY_TRACT | Status: AC
  Administered 2024-08-08: 3 mL via RESPIRATORY_TRACT
  Filled 2024-08-08: qty 3

## 2024-08-08 MED ORDER — ALBUTEROL SULFATE HFA 108 (90 BASE) MCG/ACT IN AERS
2.0000 | INHALATION_SPRAY | RESPIRATORY_TRACT | Status: DC | PRN
  Administered 2024-08-08: 2 via RESPIRATORY_TRACT
  Filled 2024-08-08: qty 6.7

## 2024-08-08 NOTE — Discharge Instructions (Signed)
 Your CT scan suggested that there may be some pneumonia.  You have been given a prescription for an antibiotic.  Please take the antibiotic until it is completely gone.  You have been given an albuterol  inhaler.  You may use 2 puffs of this every 4 hours as needed for shortness of breath or cough or wheezing.  Return to the emergency department if symptoms are getting worse.  Otherwise, follow-up with your primary care provider when you have completed the course of antibiotics.

## 2024-09-27 ENCOUNTER — Encounter: Payer: Self-pay | Admitting: Podiatry

## 2024-09-27 ENCOUNTER — Ambulatory Visit: Admitting: Podiatry

## 2024-09-27 DIAGNOSIS — M79675 Pain in left toe(s): Secondary | ICD-10-CM | POA: Diagnosis not present

## 2024-09-27 DIAGNOSIS — M79674 Pain in right toe(s): Secondary | ICD-10-CM

## 2024-09-27 DIAGNOSIS — B351 Tinea unguium: Secondary | ICD-10-CM | POA: Diagnosis not present

## 2024-09-27 DIAGNOSIS — E1142 Type 2 diabetes mellitus with diabetic polyneuropathy: Secondary | ICD-10-CM

## 2024-09-27 NOTE — Progress Notes (Signed)
  Subjective:  Patient ID: Cheryl Mckee, female    DOB: November 15, 1956,   MRN: [id number]  Chief Complaint  Patient presents with   Diabetes    I'm here for my three month checkup and to get my nails cut.   Cheryl Bill, PA - 2 mos ago ; A1c - 9.0    68 y.o. female presents for concern of thickened elongated and painful nails that are difficult to trim. Requesting to have them trimmed today. Relates burning and tingling in their feet. 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   Patient is diabetic and last A1c was  Lab Results  Component Value Date   HGBA1C 9.0 (H) 11/11/2020   .   PCP:  Cheryl Lombard, MD    . Denies any other pedal complaints. Denies n/v/f/c.   Past Medical History:  Diagnosis Date   Coronary artery disease    Diabetes mellitus without complication (HCC)    type 2   Fibromyalgia    Hypertension    Sleep apnea    could not tolerate cpap, sleep study 10 yrs ago   Urethral diverticulum 2015   evaluated by Dr. Montie   Urinary incontinence     Objective:  Physical Exam: Vascular: DP/PT pulses 2/4 bilateral. CFT <3 seconds. Absent hair growth on digits. Edema noted to bilateral lower extremities. Xerosis noted bilaterally.  Skin. No lacerations or abrasions bilateral feet. Nails 1-5 bilateral  are thickened discolored and elongated with subungual debris. Hyperkeratotic lesion noted to medial bilateral hallux. Maceration noted in bilateral first third and fourth interspaces. Improved on right but still present on left.  Musculoskeletal: MMT 5/5 bilateral lower extremities in DF, PF, Inversion and Eversion. Deceased ROM in DF of ankle joint. Amputation of left second digit.  Neurological: Sensation intact to light touch. Protective sensation diminished bilateral.    Assessment:   1. Pain due to onychomycosis of toenails of both feet   2. Type 2 diabetes mellitus with peripheral neuropathy (HCC)           Plan:  Patient was  evaluated and treated and all questions answered. -Discussed and educated patient on diabetic foot care, especially with  regards to the vascular, neurological and musculoskeletal systems.  -Stressed the importance of good glycemic control and the detriment of not  controlling glucose levels in relation to the foot. -Discussed supportive shoes at all times and checking feet regularly.  -Mechanically debrided all nails 1-5 bilateral using sterile nail nipper and filed with dremel without incident  -Maceration noted to left first third and fourth interspaced. Betadine applied and advised on care. -Advised to keep betadine in between affected areas in between toes.  -Answered all patient questions -Patient to return  in 3 months for at risk foot care -Patient advised to call the office if any problems or questions arise in the meantime.   Cheryl Mckee, DPM

## 2024-12-19 ENCOUNTER — Encounter (HOSPITAL_BASED_OUTPATIENT_CLINIC_OR_DEPARTMENT_OTHER): Attending: General Surgery | Admitting: General Surgery

## 2024-12-27 ENCOUNTER — Ambulatory Visit: Admitting: Podiatry

## 2024-12-27 ENCOUNTER — Encounter: Payer: Self-pay | Admitting: Podiatry

## 2024-12-27 DIAGNOSIS — M79674 Pain in right toe(s): Secondary | ICD-10-CM

## 2024-12-27 DIAGNOSIS — M79675 Pain in left toe(s): Secondary | ICD-10-CM | POA: Diagnosis not present

## 2024-12-27 DIAGNOSIS — E1142 Type 2 diabetes mellitus with diabetic polyneuropathy: Secondary | ICD-10-CM | POA: Diagnosis not present

## 2024-12-27 DIAGNOSIS — Z0189 Encounter for other specified special examinations: Secondary | ICD-10-CM

## 2024-12-27 DIAGNOSIS — E119 Type 2 diabetes mellitus without complications: Secondary | ICD-10-CM | POA: Diagnosis not present

## 2024-12-27 DIAGNOSIS — B351 Tinea unguium: Secondary | ICD-10-CM

## 2024-12-27 DIAGNOSIS — B353 Tinea pedis: Secondary | ICD-10-CM | POA: Diagnosis not present

## 2024-12-27 MED ORDER — KETOCONAZOLE 2 % EX CREA: 1.0000 | TOPICAL_CREAM | Freq: Every day | CUTANEOUS | 2 refills | Status: AC

## 2024-12-27 NOTE — Progress Notes (Signed)
"  °  Subjective:  Patient ID: Cheryl Mckee, female    DOB: Mar 26, 1956,   MRN: [id number]  Chief Complaint  Patient presents with   Diabetes    I'm here for my three month check.  She clips my nails and inspects my feet.  Harlene Bill, PA - 12/27/2024; A1c - 9.0    69 y.o. female presents for concern of thickened elongated and painful nails that are difficult to trim. Requesting to have them trimmed today. Relates burning and tingling in their feet. 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   Patient is diabetic and last A1c was  Lab Results  Component Value Date   HGBA1C 9.0 (H) 11/11/2020   .   She relates still having trouble in between her toes and relates not reliably using the betadine.   PCP:  Verdia Lombard, MD    . Denies any other pedal complaints. Denies n/v/f/c.   Past Medical History:  Diagnosis Date   Coronary artery disease    Diabetes mellitus without complication (HCC)    type 2   Fibromyalgia    Hypertension    Sleep apnea    could not tolerate cpap, sleep study 10 yrs ago   Urethral diverticulum 2015   evaluated by Dr. Montie   Urinary incontinence     Objective:  Physical Exam: Vascular: DP/PT pulses 2/4 bilateral. CFT <3 seconds. Absent hair growth on digits. Edema noted to bilateral lower extremities. Xerosis noted bilaterally.  Skin. No lacerations or abrasions bilateral feet. Nails 1-5 bilateral  are thickened discolored and elongated with subungual debris. Hyperkeratotic lesion noted to medial bilateral hallux. Maceration noted in bilateral first third and fourth interspaces. Improved on right but still present on left.  Musculoskeletal: MMT 5/5 bilateral lower extremities in DF, PF, Inversion and Eversion. Deceased ROM in DF of ankle joint. Amputation of left second digit.  Neurological: Sensation intact to light touch. Protective sensation diminished bilateral.    Assessment:   1. Pain due to onychomycosis of toenails of  both feet   2. Type 2 diabetes mellitus with peripheral neuropathy (HCC)   3. Tinea pedis of both feet   4. Encounter for diabetic foot exam (HCC)            Plan:  Patient was evaluated and treated and all questions answered. -Discussed and educated patient on diabetic foot care, especially with  regards to the vascular, neurological and musculoskeletal systems.  -Stressed the importance of good glycemic control and the detriment of not  controlling glucose levels in relation to the foot. -Discussed supportive shoes at all times and checking feet regularly.  -Mechanically debrided all nails 1-5 bilateral using sterile nail nipper and filed with dremel without incident  -Maceration noted to left first third and fourth interspaced. Betadine applied and advised on care. -Advised to keep betadine in between affected areas in between toes. Will also send in ketoconazole  to help with these areas of possible fungus.  -Answered all patient questions -Patient to return  in 3 months for at risk foot care -Patient advised to call the office if any problems or questions arise in the meantime.   Asberry Failing, DPM   "

## 2025-03-28 ENCOUNTER — Ambulatory Visit: Admitting: Podiatry
